# Patient Record
Sex: Female | Born: 1970 | Race: White | Hispanic: No | Marital: Married | Smoking: Never smoker
Health system: Southern US, Community
[De-identification: ages and names within clinical notes are randomized; demographics above are authoritative.]

## PROBLEM LIST (undated history)

## (undated) DIAGNOSIS — Z8249 Family history of ischemic heart disease and other diseases of the circulatory system: Secondary | ICD-10-CM

## (undated) DIAGNOSIS — O09529 Supervision of elderly multigravida, unspecified trimester: Secondary | ICD-10-CM

## (undated) DIAGNOSIS — G4713 Recurrent hypersomnia: Secondary | ICD-10-CM

## (undated) DIAGNOSIS — Z833 Family history of diabetes mellitus: Secondary | ICD-10-CM

## (undated) DIAGNOSIS — Z8742 Personal history of other diseases of the female genital tract: Secondary | ICD-10-CM

## (undated) DIAGNOSIS — N119 Chronic tubulo-interstitial nephritis, unspecified: Secondary | ICD-10-CM

## (undated) DIAGNOSIS — I471 Supraventricular tachycardia: Secondary | ICD-10-CM

## (undated) DIAGNOSIS — R3915 Urgency of urination: Secondary | ICD-10-CM

## (undated) DIAGNOSIS — Z8349 Family history of other endocrine, nutritional and metabolic diseases: Secondary | ICD-10-CM

## (undated) DIAGNOSIS — IMO0002 Reserved for concepts with insufficient information to code with codable children: Secondary | ICD-10-CM

## (undated) DIAGNOSIS — I499 Cardiac arrhythmia, unspecified: Secondary | ICD-10-CM

## (undated) DIAGNOSIS — Z820 Family history of epilepsy and other diseases of the nervous system: Secondary | ICD-10-CM

## (undated) DIAGNOSIS — F419 Anxiety disorder, unspecified: Secondary | ICD-10-CM

## (undated) DIAGNOSIS — B373 Candidiasis of vulva and vagina: Secondary | ICD-10-CM

## (undated) DIAGNOSIS — K52839 Microscopic colitis, unspecified: Secondary | ICD-10-CM

## (undated) DIAGNOSIS — Z8619 Personal history of other infectious and parasitic diseases: Secondary | ICD-10-CM

## (undated) DIAGNOSIS — R896 Abnormal cytological findings in specimens from other organs, systems and tissues: Secondary | ICD-10-CM

## (undated) DIAGNOSIS — K589 Irritable bowel syndrome without diarrhea: Secondary | ICD-10-CM

## (undated) DIAGNOSIS — M1990 Unspecified osteoarthritis, unspecified site: Secondary | ICD-10-CM

## (undated) DIAGNOSIS — Z87440 Personal history of urinary (tract) infections: Secondary | ICD-10-CM

## (undated) HISTORY — DX: Unspecified osteoarthritis, unspecified site: M19.90

## (undated) HISTORY — PX: ADENOIDECTOMY: SUR15

## (undated) HISTORY — DX: Abnormal cytological findings in specimens from other organs, systems and tissues: R89.6

## (undated) HISTORY — DX: Family history of other endocrine, nutritional and metabolic diseases: Z83.49

## (undated) HISTORY — DX: Urgency of urination: R39.15

## (undated) HISTORY — DX: Microscopic colitis, unspecified: K52.839

## (undated) HISTORY — DX: Supervision of elderly multigravida, unspecified trimester: O09.529

## (undated) HISTORY — PX: TYMPANOSTOMY TUBE PLACEMENT: SHX32

## (undated) HISTORY — DX: Family history of ischemic heart disease and other diseases of the circulatory system: Z82.49

## (undated) HISTORY — DX: Personal history of other diseases of the female genital tract: Z87.42

## (undated) HISTORY — DX: Supraventricular tachycardia: I47.1

## (undated) HISTORY — DX: Chronic tubulo-interstitial nephritis, unspecified: N11.9

## (undated) HISTORY — DX: Family history of diabetes mellitus: Z83.3

## (undated) HISTORY — DX: Personal history of urinary (tract) infections: Z87.440

## (undated) HISTORY — DX: Anxiety disorder, unspecified: F41.9

## (undated) HISTORY — DX: Reserved for concepts with insufficient information to code with codable children: IMO0002

## (undated) HISTORY — DX: Recurrent hypersomnia: G47.13

## (undated) HISTORY — DX: Family history of epilepsy and other diseases of the nervous system: Z82.0

## (undated) HISTORY — DX: Personal history of other infectious and parasitic diseases: Z86.19

## (undated) HISTORY — DX: Candidiasis of vulva and vagina: B37.3

---

## 1997-11-21 ENCOUNTER — Inpatient Hospital Stay (HOSPITAL_COMMUNITY): Admission: AD | Admit: 1997-11-21 | Discharge: 1997-11-23 | Payer: Self-pay | Admitting: Obstetrics & Gynecology

## 1998-05-08 ENCOUNTER — Other Ambulatory Visit: Admission: RE | Admit: 1998-05-08 | Discharge: 1998-05-08 | Payer: Self-pay | Admitting: Obstetrics & Gynecology

## 1999-02-23 ENCOUNTER — Other Ambulatory Visit: Admission: RE | Admit: 1999-02-23 | Discharge: 1999-02-23 | Payer: Self-pay | Admitting: *Deleted

## 1999-03-06 ENCOUNTER — Inpatient Hospital Stay (HOSPITAL_COMMUNITY): Admission: AD | Admit: 1999-03-06 | Discharge: 1999-03-06 | Payer: Self-pay | Admitting: *Deleted

## 1999-09-26 ENCOUNTER — Inpatient Hospital Stay (HOSPITAL_COMMUNITY): Admission: AD | Admit: 1999-09-26 | Discharge: 1999-09-28 | Payer: Self-pay | Admitting: *Deleted

## 2000-03-18 ENCOUNTER — Other Ambulatory Visit: Admission: RE | Admit: 2000-03-18 | Discharge: 2000-03-18 | Payer: Self-pay | Admitting: Obstetrics & Gynecology

## 2000-10-04 DIAGNOSIS — G4713 Recurrent hypersomnia: Secondary | ICD-10-CM

## 2000-10-04 HISTORY — DX: Recurrent hypersomnia: G47.13

## 2001-04-03 ENCOUNTER — Other Ambulatory Visit: Admission: RE | Admit: 2001-04-03 | Discharge: 2001-04-03 | Payer: Self-pay | Admitting: Obstetrics and Gynecology

## 2001-08-07 ENCOUNTER — Ambulatory Visit (HOSPITAL_COMMUNITY): Admission: RE | Admit: 2001-08-07 | Discharge: 2001-08-07 | Payer: Self-pay | Admitting: Obstetrics and Gynecology

## 2001-08-07 ENCOUNTER — Encounter: Payer: Self-pay | Admitting: Obstetrics and Gynecology

## 2001-08-30 ENCOUNTER — Ambulatory Visit (HOSPITAL_COMMUNITY): Admission: RE | Admit: 2001-08-30 | Discharge: 2001-08-30 | Payer: Self-pay | Admitting: Obstetrics and Gynecology

## 2001-08-30 ENCOUNTER — Encounter: Payer: Self-pay | Admitting: Obstetrics and Gynecology

## 2002-01-17 ENCOUNTER — Inpatient Hospital Stay (HOSPITAL_COMMUNITY): Admission: AD | Admit: 2002-01-17 | Discharge: 2002-01-19 | Payer: Self-pay | Admitting: Obstetrics and Gynecology

## 2002-02-28 ENCOUNTER — Other Ambulatory Visit: Admission: RE | Admit: 2002-02-28 | Discharge: 2002-02-28 | Payer: Self-pay | Admitting: Obstetrics and Gynecology

## 2003-03-15 ENCOUNTER — Other Ambulatory Visit: Admission: RE | Admit: 2003-03-15 | Discharge: 2003-03-15 | Payer: Self-pay | Admitting: Obstetrics and Gynecology

## 2003-09-05 ENCOUNTER — Other Ambulatory Visit: Admission: RE | Admit: 2003-09-05 | Discharge: 2003-09-05 | Payer: Self-pay | Admitting: Obstetrics and Gynecology

## 2004-07-05 ENCOUNTER — Inpatient Hospital Stay (HOSPITAL_COMMUNITY): Admission: AD | Admit: 2004-07-05 | Discharge: 2004-07-05 | Payer: Self-pay | Admitting: Obstetrics and Gynecology

## 2005-03-03 IMAGING — US US OB COMP LESS 14 WK
1 series · 18 of 28 positions shown · non-contrast
Comparison: none

CLINICAL DATA: 33-year-old female, 10 weeks pregnant by last menstrual period with vaginal bleeding.
OBSTETRICAL ULTRASOUND TRANSABDOMINAL AND TRANSVAGINAL

[Series 1: us ob comp<14 wk · 18 of 52 slices shown]
[im 1/52]
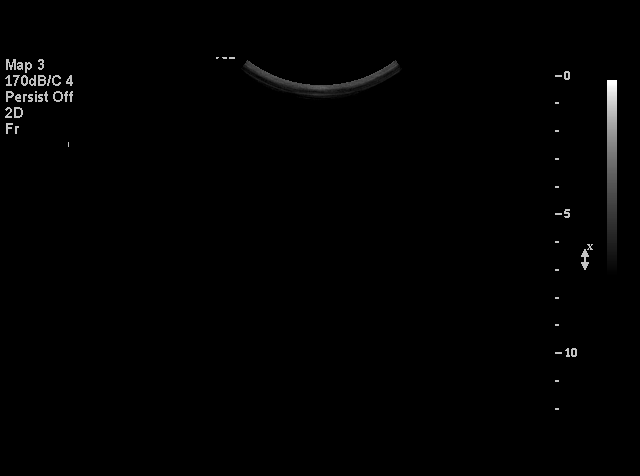
[im 4/52]
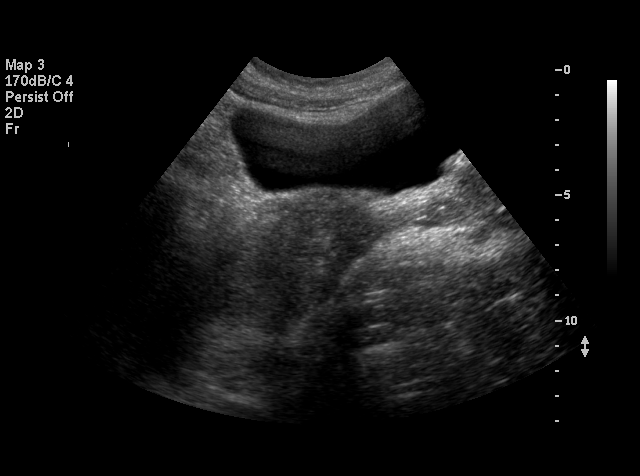
[im 6/52]
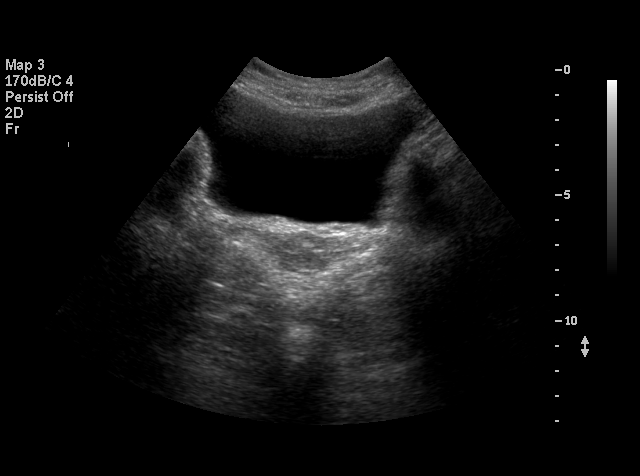
[im 10/52]
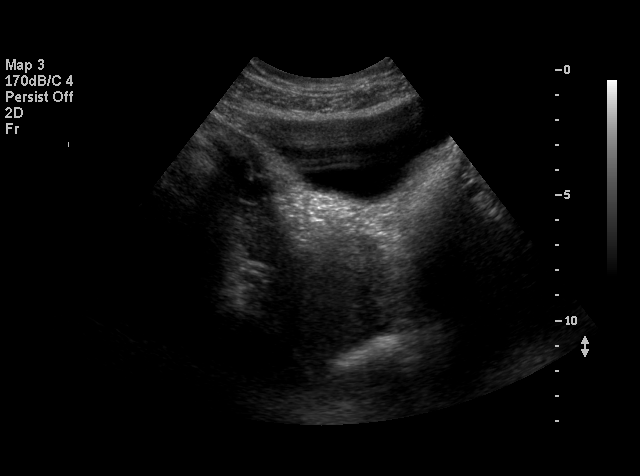
[im 14/52]
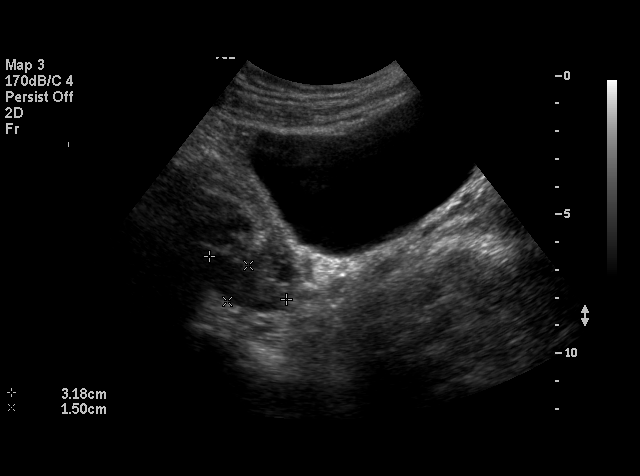
[im 16/52]
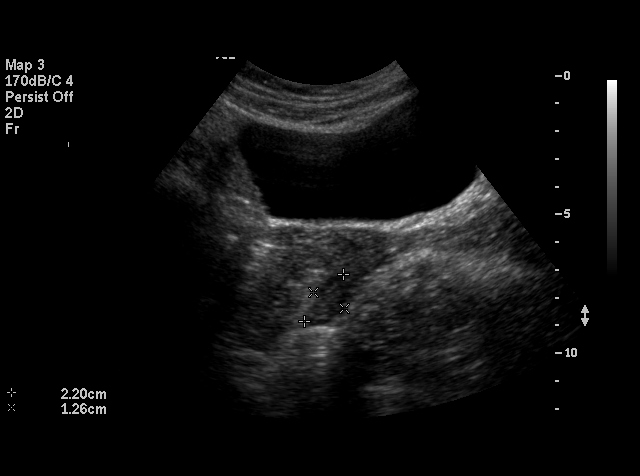
[im 19/52]
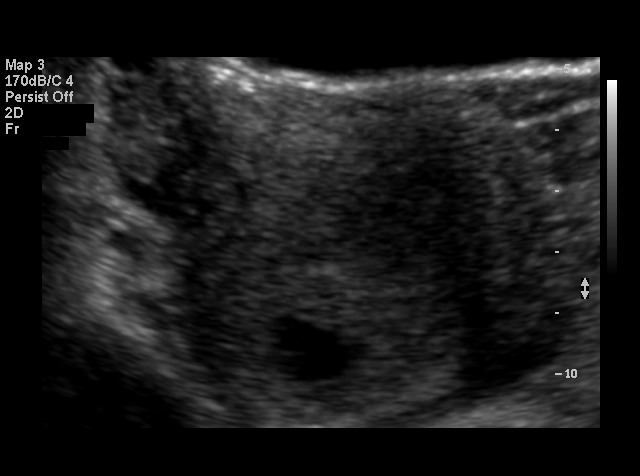
[im 21/52]
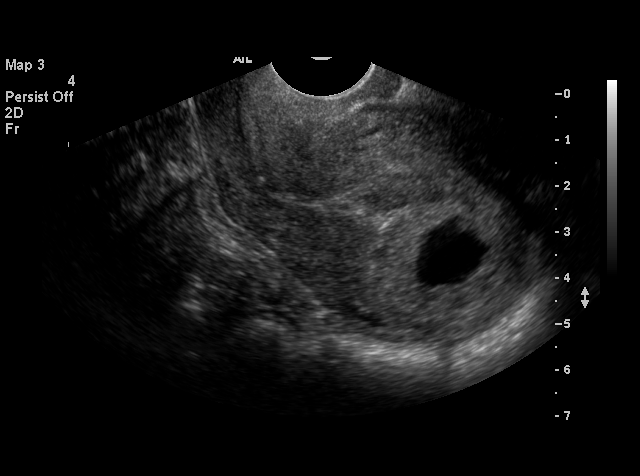
[im 25/52]
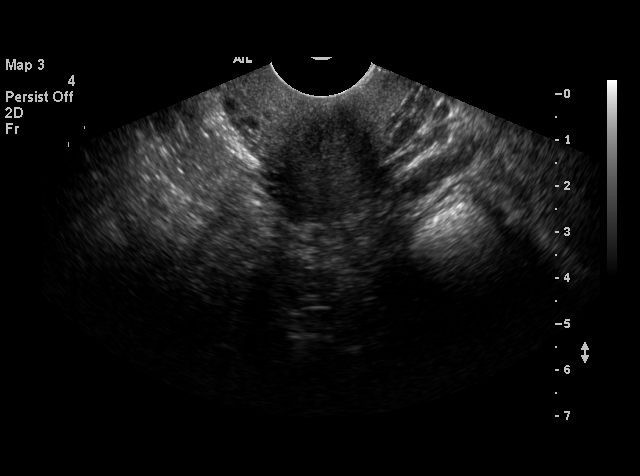
[im 27/52]
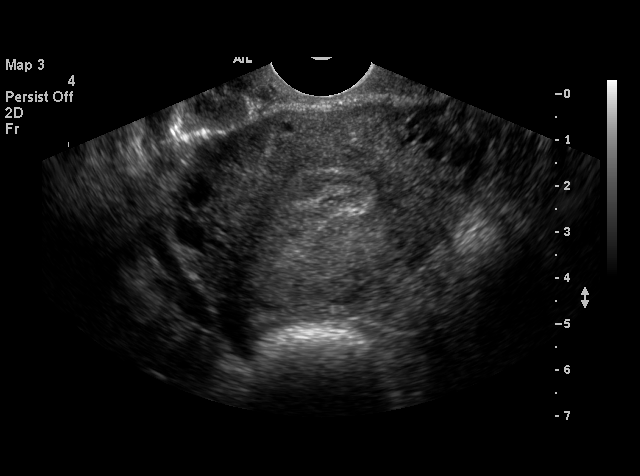
[im 31/52]
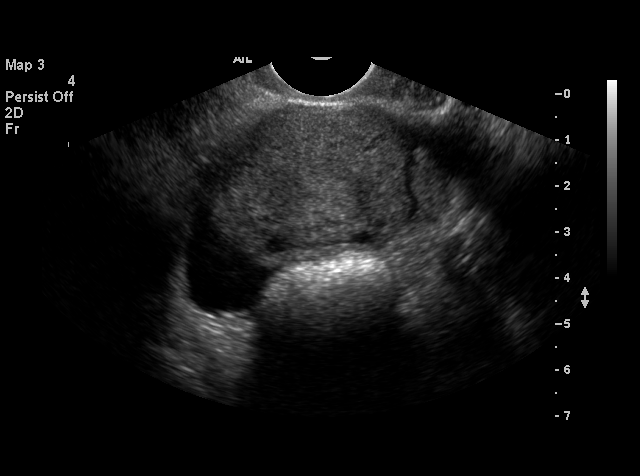
[im 33/52]
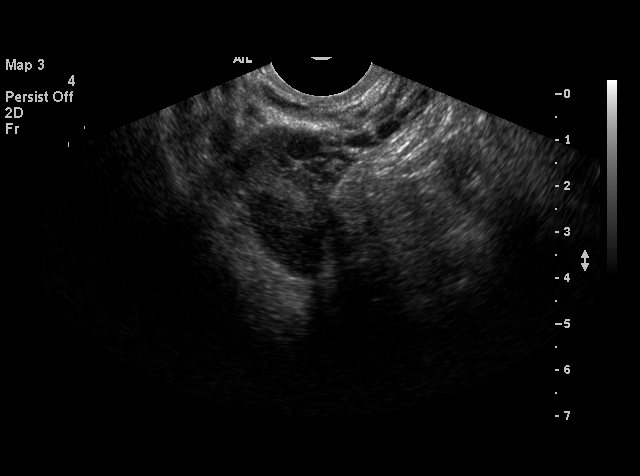
[im 36/52]
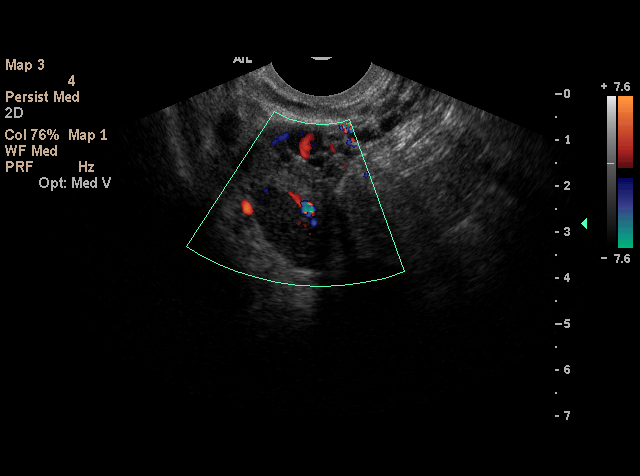
[im 40/52]
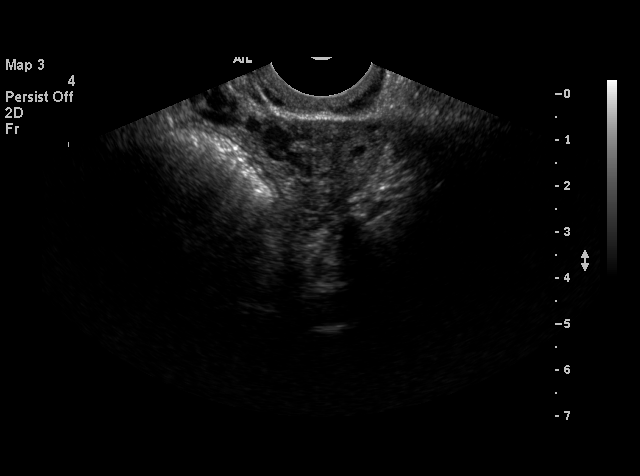
[im 42/52]
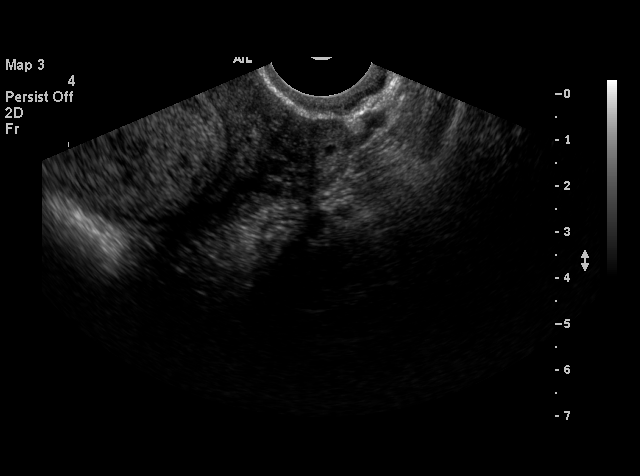
[im 46/52]
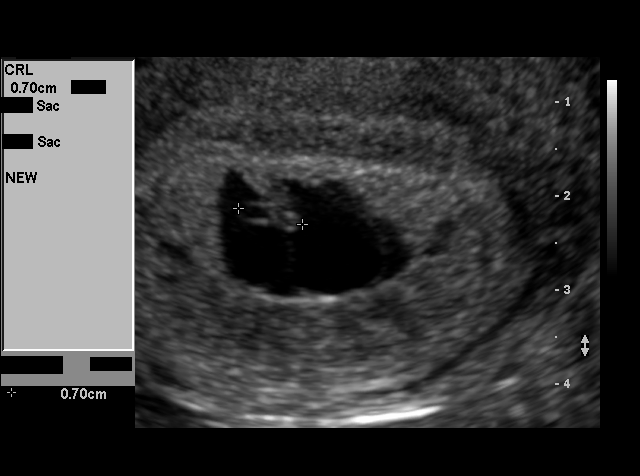
[im 48/52]
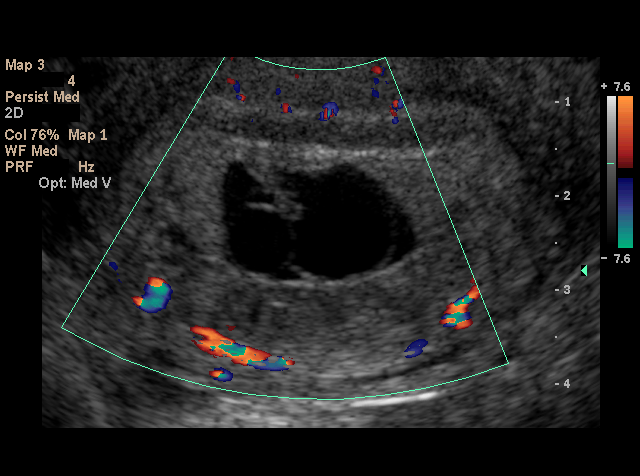
[im 52/52]
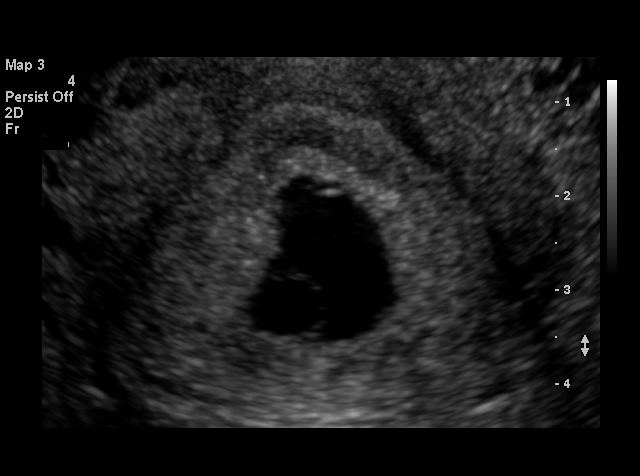

[18 of 28 positions shown; findings below may reference images not displayed]

FINDINGS: the uterus is retroverted in position with a small amount of free fluid.  Within the uterine fundus, there is an intrauterine gestational sac noted.  The yolk sac is visible as well as the amnion.  A small fetal pole is noted adjacent to the yolk sac, which is smaller than would be expected for the gestational sac and the size of the adjacent yolk sac.  By crown-rump length measurement (7 mm), the gestational age is estimated at 6 weeks 5 days.  
During the exam, there is no visualized fetal movement or cardiac movement.  With M-mode Doppler, no detectable heart rate.  The findings are consistent with a fetal demise.  Right ovary measures 2.7 x 1.8 x 1.6 cm and contains a small corpus luteum cyst measuring 1.6 x 1.2 cm.  Left ovary measures 2.2 x 1.3 x 1.9 cm.  
IMPRESSION 
Abnormal gestational sac with a small fetal pole noted in relation to the size of the yolk sac and the overall gestational sac diameter.  In addition, there is no visualized cardiac activity or detectable heart rate with M-mode Doppler.  Findings are consistent with fetal demise.  Recommend correlation with the quantitative beta hCG and follow-up as indicated.  
Gestational age by crown-rump length of 7 mm is 6 weeks 5 days.

## 2005-04-08 ENCOUNTER — Other Ambulatory Visit: Admission: RE | Admit: 2005-04-08 | Discharge: 2005-04-08 | Payer: Self-pay | Admitting: Obstetrics and Gynecology

## 2006-05-27 ENCOUNTER — Other Ambulatory Visit: Admission: RE | Admit: 2006-05-27 | Discharge: 2006-05-27 | Payer: Self-pay | Admitting: Obstetrics and Gynecology

## 2009-10-04 DIAGNOSIS — I471 Supraventricular tachycardia, unspecified: Secondary | ICD-10-CM

## 2009-10-04 HISTORY — DX: Supraventricular tachycardia, unspecified: I47.10

## 2009-10-04 HISTORY — DX: Supraventricular tachycardia: I47.1

## 2009-10-07 ENCOUNTER — Encounter: Payer: Self-pay | Admitting: Cardiovascular Disease

## 2010-03-10 ENCOUNTER — Ambulatory Visit (HOSPITAL_COMMUNITY): Admission: RE | Admit: 2010-03-10 | Discharge: 2010-03-10 | Payer: Self-pay | Admitting: Obstetrics and Gynecology

## 2010-09-08 ENCOUNTER — Ambulatory Visit: Payer: Self-pay | Admitting: Cardiovascular Disease

## 2011-01-05 LAB — HIV ANTIBODY (ROUTINE TESTING W REFLEX): HIV: NONREACTIVE

## 2011-01-05 LAB — HEPATITIS B SURFACE ANTIGEN: Hepatitis B Surface Ag: NEGATIVE

## 2011-01-05 LAB — ABO/RH: RH Type: POSITIVE

## 2011-01-05 LAB — ANTIBODY SCREEN: Antibody Screen: NEGATIVE

## 2011-01-05 LAB — RUBELLA ANTIBODY, IGM: Rubella: IMMUNE

## 2011-02-02 ENCOUNTER — Other Ambulatory Visit: Payer: Self-pay | Admitting: *Deleted

## 2011-02-02 MED ORDER — LABETALOL HCL 100 MG PO TABS: 100.0000 mg | ORAL_TABLET | Freq: Two times a day (BID) | ORAL | Status: DC

## 2011-02-02 NOTE — Telephone Encounter (Signed)
Fax received from pharmacy. Refill completed. Jodette Meredith Kilbride RN  

## 2011-02-08 ENCOUNTER — Other Ambulatory Visit: Payer: Self-pay | Admitting: Cardiovascular Disease

## 2011-02-08 MED ORDER — LABETALOL HCL 100 MG PO TABS: 100.0000 mg | ORAL_TABLET | Freq: Two times a day (BID) | ORAL | Status: DC

## 2011-02-08 NOTE — Telephone Encounter (Signed)
Medication refill 90 day supply of Labetalol

## 2011-02-19 NOTE — H&P (Signed)
[facility]  Patient:    Kelsey Johnson, Kelsey Johnson: [id number] MRN: [id number]          Service Type: OBS Location: [id number] Attending Physician:  Kelsey Johnson A Dictated by:   Kelsey Johnson, C.N.M. Admit Date:  01/17/2002                           History and Physical  CHIEF COMPLAINT:              Kelsey Johnson is a 40 year old married white female, gravida 3 para 2, 0-0-2, at [redacted] weeks gestation by last menstrual period, who presents complaining of uterine contractions every three to four minutes for the last two hours.  HISTORY OF PRESENT ILLNESS:   She denies any leaking or vaginal bleeding.  She denies any nausea, vomiting, headaches, or visual disturbances.  Her pregnancy has been followed at [facility] by the Certified Nurse Midwife service and has been essentially uncomplicated, though at risk for:                               1. Positive group B strep.                               2. Conception on oral contraceptives.                               3. Mild asthma.                               4. History of brain lesion requiring Tegretol,                                  followed by Dr. Raquel Johnson.  OB/GYN HISTORY:               1. She is gravida 3 para 2, 0-0-2, who delivered                                  a viable female infant in February 1999 that                                  weighed 7 pounds 11 ounces at [redacted] weeks                                  gestation following an eight hour labor.                                  That infants name is Kelsey Johnson, and had no                                  complications.  2. In December 2000 she delivered a viable female                                  infant who weighed 7 pounds 10 ounces at [redacted]                                  weeks gestation following a 10 hour labor,                                  and required vacuum assistance secondary  to                                  persistent OP presentation.  ALLERGIES:                    SULFA.  PAST MEDICAL HISTORY:         1. She reports having had the usual childhood                                  diseases.                               2. History of mild asthma.  Has not required                                  inhalers recently.                               3. History of occasional urinary tract                                  infections.                               4. 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 Raquel Johnson, as well as she also takes                                  Xanax occasionally (has not taken it                                  throughout this pregnancy much), and  Zoloft (she has taken every day).  PAST SURGICAL HISTORY:        1. Adenoidectomy.                               2. Tubes in ears as an infant.  FAMILY HISTORY:               Significant for maternal grandfather with MI. Paternal grandfather and paternal grandmother with chronic hypertension. Father and paternal grandfather with non-insulin dependent diabetes.  SOCIAL HISTORY:               She is married to Kelsey Johnson, who is involved and supportive.  They are of the [place] faith.  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  They deny any illicit drug use, alcohol, or smoking with this pregnancy.  PRENATAL LABORATORY DATA:     Her blood type is A-positive.  Antibody screen negative.  Syphilis nonreactive.  Rubella immune.  Hepatitis B surface antigen negative.  Pap smear normal.  GC and Chlamydia negative.  One hour glucola was 85 and maternal serum alpha-fetoprotein was within normal range.  Her 36 week beta strep was positive.  PHYSICAL EXAMINATION:  VITAL SIGNS:                   Stable.  She is afebrile.  HEENT:                        Grossly within normal limits.  HEART:                        Regular rhythm and rate.  CHEST:                        Clear.  BREAST:                       Soft, nontender.  ABDOMEN:                      Gravid, with uterine contractions every two to                               three minutes that are strong to palpation.  PELVIC:                       Her cervix is 5 cm, 100%, vertex -1 with bulging membranes.  EXTREMITIES:                  Within normal limits.  ASSESSMENT:                   1. Intrauterine pregnancy at term.                               2. Active labor.                               3. Positive group B streptococci.  PLAN:                         1.  Admit to labor and delivery.                               2. Follow routine CNM orders.                               3. Give penicillin for group B strep                                  prophylaxis.                               4. Notify the M.D. on-call of the patients                                  admission. Dictated by:   Kelsey Johnson, C.N.M. Attending Physician:  Kelsey Johnson DD:  01/17/02 TD:  01/17/02 Job: [id number]

## 2011-07-07 LAB — STREP B DNA PROBE: GBS: NEGATIVE

## 2011-07-27 ENCOUNTER — Inpatient Hospital Stay (HOSPITAL_COMMUNITY): Payer: 59 | Admitting: Anesthesiology

## 2011-07-27 ENCOUNTER — Inpatient Hospital Stay (HOSPITAL_COMMUNITY)
Admission: AD | Admit: 2011-07-27 | Discharge: 2011-07-29 | DRG: 775 | Disposition: A | Discharge status: Home / Self Care | Payer: 59 | Source: Ambulatory Visit | Attending: Obstetrics and Gynecology | Admitting: Obstetrics and Gynecology

## 2011-07-27 ENCOUNTER — Encounter (HOSPITAL_COMMUNITY): Payer: Self-pay | Admitting: Anesthesiology

## 2011-07-27 ENCOUNTER — Encounter (HOSPITAL_COMMUNITY): Payer: Self-pay | Admitting: *Deleted

## 2011-07-27 ENCOUNTER — Encounter (HOSPITAL_COMMUNITY): Payer: Self-pay | Admitting: Obstetrics and Gynecology

## 2011-07-27 DIAGNOSIS — K589 Irritable bowel syndrome without diarrhea: Secondary | ICD-10-CM | POA: Diagnosis present

## 2011-07-27 DIAGNOSIS — I471 Supraventricular tachycardia, unspecified: Secondary | ICD-10-CM | POA: Diagnosis present

## 2011-07-27 DIAGNOSIS — O99892 Other specified diseases and conditions complicating childbirth: Secondary | ICD-10-CM | POA: Diagnosis present

## 2011-07-27 DIAGNOSIS — I498 Other specified cardiac arrhythmias: Secondary | ICD-10-CM | POA: Diagnosis present

## 2011-07-27 DIAGNOSIS — IMO0002 Reserved for concepts with insufficient information to code with codable children: Secondary | ICD-10-CM | POA: Diagnosis present

## 2011-07-27 DIAGNOSIS — J45909 Unspecified asthma, uncomplicated: Secondary | ICD-10-CM | POA: Diagnosis not present

## 2011-07-27 DIAGNOSIS — Z3490 Encounter for supervision of normal pregnancy, unspecified, unspecified trimester: Secondary | ICD-10-CM

## 2011-07-27 DIAGNOSIS — O09529 Supervision of elderly multigravida, unspecified trimester: Secondary | ICD-10-CM | POA: Diagnosis present

## 2011-07-27 HISTORY — DX: Cardiac arrhythmia, unspecified: I49.9

## 2011-07-27 HISTORY — DX: Irritable bowel syndrome, unspecified: K58.9

## 2011-07-27 LAB — CBC
MCH: 30.9 pg (ref 26.0–34.0)
MCHC: 33.9 g/dL (ref 30.0–36.0)
MCV: 91.4 fL (ref 78.0–100.0)
Platelets: 294 10*3/uL (ref 150–400)
RDW: 13.3 % (ref 11.5–15.5)

## 2011-07-27 LAB — RPR: RPR Ser Ql: NONREACTIVE

## 2011-07-27 MED ORDER — PHENYLEPHRINE 40 MCG/ML (10ML) SYRINGE FOR IV PUSH (FOR BLOOD PRESSURE SUPPORT)
80.0000 ug | PREFILLED_SYRINGE | INTRAVENOUS | Status: DC | PRN
  Filled 2011-07-27: qty 5

## 2011-07-27 MED ORDER — EPHEDRINE 5 MG/ML INJ
10.0000 mg | INTRAVENOUS | Status: DC | PRN
  Filled 2011-07-27: qty 4

## 2011-07-27 MED ORDER — LABETALOL HCL 100 MG PO TABS
100.0000 mg | ORAL_TABLET | Freq: Two times a day (BID) | ORAL | Status: DC
  Administered 2011-07-27 – 2011-07-29 (×4): 100 mg via ORAL
  Filled 2011-07-27 (×5): qty 1

## 2011-07-27 MED ORDER — OXYTOCIN 10 UNIT/ML IJ SOLN: 10.0000 [IU] | Freq: Once | INTRAMUSCULAR | Status: DC

## 2011-07-27 MED ORDER — PHENYLEPHRINE 40 MCG/ML (10ML) SYRINGE FOR IV PUSH (FOR BLOOD PRESSURE SUPPORT)
80.0000 ug | PREFILLED_SYRINGE | INTRAVENOUS | Status: DC | PRN
  Filled 2011-07-27 (×2): qty 5

## 2011-07-27 MED ORDER — PRENATAL PLUS 27-1 MG PO TABS
1.0000 | ORAL_TABLET | Freq: Every day | ORAL | Status: DC
  Administered 2011-07-28: 1 via ORAL
  Filled 2011-07-27: qty 1

## 2011-07-27 MED ORDER — LACTATED RINGERS IV SOLN: 500.0000 mL | Freq: Once | INTRAVENOUS | Status: DC

## 2011-07-27 MED ORDER — MEDROXYPROGESTERONE ACETATE 150 MG/ML IM SUSP: 150.0000 mg | INTRAMUSCULAR | Status: DC | PRN

## 2011-07-27 MED ORDER — DIPHENHYDRAMINE HCL 50 MG/ML IJ SOLN: 12.5000 mg | INTRAMUSCULAR | Status: DC | PRN

## 2011-07-27 MED ORDER — MAGNESIUM HYDROXIDE 400 MG/5ML PO SUSP: 30.0000 mL | ORAL | Status: DC | PRN

## 2011-07-27 MED ORDER — LACTATED RINGERS IV SOLN: 500.0000 mL | INTRAVENOUS | Status: DC | PRN

## 2011-07-27 MED ORDER — OXYTOCIN BOLUS FROM INFUSION
500.0000 mL | Freq: Once | INTRAVENOUS | Status: DC
  Filled 2011-07-27: qty 1000
  Filled 2011-07-27: qty 500

## 2011-07-27 MED ORDER — TERBUTALINE SULFATE 1 MG/ML IJ SOLN: 0.2500 mg | Freq: Once | INTRAMUSCULAR | Status: DC | PRN

## 2011-07-27 MED ORDER — OXYTOCIN 20 UNITS IN LACTATED RINGERS INFUSION - SIMPLE: 125.0000 mL/h | Freq: Once | INTRAVENOUS | Status: DC

## 2011-07-27 MED ORDER — ONDANSETRON HCL 4 MG/2ML IJ SOLN: 4.0000 mg | Freq: Four times a day (QID) | INTRAMUSCULAR | Status: DC | PRN

## 2011-07-27 MED ORDER — SENNOSIDES-DOCUSATE SODIUM 8.6-50 MG PO TABS
2.0000 | ORAL_TABLET | Freq: Every day | ORAL | Status: DC
  Administered 2011-07-28: 2 via ORAL

## 2011-07-27 MED ORDER — METHYLERGONOVINE MALEATE 0.2 MG/ML IJ SOLN: 0.2000 mg | INTRAMUSCULAR | Status: DC | PRN

## 2011-07-27 MED ORDER — ACETAMINOPHEN 325 MG PO TABS: 650.0000 mg | ORAL_TABLET | ORAL | Status: DC | PRN

## 2011-07-27 MED ORDER — PRENATAL PLUS 27-1 MG PO TABS
1.0000 | ORAL_TABLET | Freq: Every day | ORAL | Status: DC
  Administered 2011-07-29: 1 via ORAL
  Filled 2011-07-27: qty 1

## 2011-07-27 MED ORDER — IBUPROFEN 600 MG PO TABS
600.0000 mg | ORAL_TABLET | Freq: Four times a day (QID) | ORAL | Status: DC | PRN
  Filled 2011-07-27 (×5): qty 1

## 2011-07-27 MED ORDER — EPHEDRINE 5 MG/ML INJ
10.0000 mg | INTRAVENOUS | Status: DC | PRN
  Filled 2011-07-27 (×2): qty 4

## 2011-07-27 MED ORDER — LIDOCAINE HCL (PF) 1 % IJ SOLN
30.0000 mL | INTRAMUSCULAR | Status: DC | PRN
  Filled 2011-07-27: qty 30

## 2011-07-27 MED ORDER — ZOLPIDEM TARTRATE 5 MG PO TABS: 5.0000 mg | ORAL_TABLET | Freq: Every evening | ORAL | Status: DC | PRN

## 2011-07-27 MED ORDER — WITCH HAZEL-GLYCERIN EX PADS
1.0000 "application " | MEDICATED_PAD | CUTANEOUS | Status: DC | PRN
  Administered 2011-07-28: 1 via TOPICAL

## 2011-07-27 MED ORDER — OXYTOCIN 20 UNITS IN LACTATED RINGERS INFUSION - SIMPLE
1.0000 m[IU]/min | INTRAVENOUS | Status: DC
  Administered 2011-07-27: 1 m[IU]/min via INTRAVENOUS
  Administered 2011-07-27: 333 m[IU]/min via INTRAVENOUS

## 2011-07-27 MED ORDER — FENTANYL 2.5 MCG/ML BUPIVACAINE 1/10 % EPIDURAL INFUSION (WH - ANES)
INTRAMUSCULAR | Status: DC | PRN
  Administered 2011-07-27: 14 mL/h via EPIDURAL

## 2011-07-27 MED ORDER — LIDOCAINE HCL 1.5 % IJ SOLN
INTRAMUSCULAR | Status: DC | PRN
  Administered 2011-07-27 (×2): 5 mL via EPIDURAL

## 2011-07-27 MED ORDER — METHYLERGONOVINE MALEATE 0.2 MG PO TABS: 0.2000 mg | ORAL_TABLET | ORAL | Status: DC | PRN

## 2011-07-27 MED ORDER — LANOLIN HYDROUS EX OINT: TOPICAL_OINTMENT | CUTANEOUS | Status: DC | PRN

## 2011-07-27 MED ORDER — TETANUS-DIPHTH-ACELL PERTUSSIS 5-2.5-18.5 LF-MCG/0.5 IM SUSP
0.5000 mL | Freq: Once | INTRAMUSCULAR | Status: AC
  Administered 2011-07-28: 0.5 mL via INTRAMUSCULAR
  Filled 2011-07-27 (×2): qty 0.5

## 2011-07-27 MED ORDER — OXYCODONE-ACETAMINOPHEN 5-325 MG PO TABS: 2.0000 | ORAL_TABLET | ORAL | Status: DC | PRN

## 2011-07-27 MED ORDER — BENZOCAINE-MENTHOL 20-0.5 % EX AERO
1.0000 "application " | INHALATION_SPRAY | CUTANEOUS | Status: DC | PRN
  Administered 2011-07-28: 1 via TOPICAL

## 2011-07-27 MED ORDER — LACTATED RINGERS IV SOLN
INTRAVENOUS | Status: DC
  Administered 2011-07-27 (×2): via INTRAVENOUS

## 2011-07-27 MED ORDER — CITRIC ACID-SODIUM CITRATE 334-500 MG/5ML PO SOLN: 30.0000 mL | ORAL | Status: DC | PRN

## 2011-07-27 MED ORDER — SIMETHICONE 80 MG PO CHEW: 80.0000 mg | CHEWABLE_TABLET | ORAL | Status: DC | PRN

## 2011-07-27 MED ORDER — ONDANSETRON HCL 4 MG/2ML IJ SOLN: 4.0000 mg | INTRAMUSCULAR | Status: DC | PRN

## 2011-07-27 MED ORDER — ONDANSETRON HCL 4 MG PO TABS: 4.0000 mg | ORAL_TABLET | ORAL | Status: DC | PRN

## 2011-07-27 MED ORDER — IBUPROFEN 600 MG PO TABS
600.0000 mg | ORAL_TABLET | Freq: Four times a day (QID) | ORAL | Status: DC
  Administered 2011-07-28 – 2011-07-29 (×6): 600 mg via ORAL
  Filled 2011-07-27: qty 1

## 2011-07-27 MED ORDER — FENTANYL 2.5 MCG/ML BUPIVACAINE 1/10 % EPIDURAL INFUSION (WH - ANES)
14.0000 mL/h | INTRAMUSCULAR | Status: DC
  Filled 2011-07-27: qty 60

## 2011-07-27 MED ORDER — DIBUCAINE 1 % RE OINT
1.0000 "application " | TOPICAL_OINTMENT | RECTAL | Status: DC | PRN
  Administered 2011-07-28: 1 via RECTAL
  Filled 2011-07-27: qty 28

## 2011-07-27 MED ORDER — DIPHENHYDRAMINE HCL 25 MG PO CAPS: 25.0000 mg | ORAL_CAPSULE | Freq: Four times a day (QID) | ORAL | Status: DC | PRN

## 2011-07-27 MED ORDER — OXYCODONE-ACETAMINOPHEN 5-325 MG PO TABS
1.0000 | ORAL_TABLET | ORAL | Status: DC | PRN
  Administered 2011-07-28: 1 via ORAL
  Filled 2011-07-27: qty 1
  Filled 2011-07-27: qty 2

## 2011-07-27 NOTE — Progress Notes (Signed)
.  Subjective: C/o some increased pain but coping well, feels "discouraged"   Objective: BP 140/91  Pulse 95  Temp(Src) 98.9 F (37.2 C) (Oral)  Ht 5\' 2"  (1.575 m)  Wt 68.947 kg (152 lb)  BMI 27.80 kg/m2   FHT:  FHR: 140 bpm, variability: moderate,  accelerations:  Present,  decelerations:  Absent UC:   irregular, every 5-6 minutes SVE:   Dilation: 1.5 Effacement (%): 90 Station: -1 Exam by:: Daneli Butkiewicz,cnm    Assessment / Plan: SROM No active labor No s/s chorioamnioitis    Fetal Wellbeing:  Category I Pain Control:  declines at this time  Discussed options including starting pitocin, pt agrees at this time.   Dr Stefano Gaul updated  Malissa Hippo 07/27/2011, 5:22 PM

## 2011-07-27 NOTE — Progress Notes (Signed)
Delivery Note Pt pushed w/ approximately 3 ctxs, w/ good control, attempting to allow some perineal stretching, to SVD at 8:24 PM a viable female "Kelsey Johnson" was delivered via Vaginal, Spontaneous Delivery (Presentation: Right Occiput Anterior).  APGAR: 8, 9; weight 6 lb 9 oz (2977 g).  LNC x1, reduced over body via somersault.  Placed immediately on mom's abdomen where vigorous cry w/ stimulation.   Placenta status: Intact, Spontaneous, Schultz.  Cord: 3 vessels with the following complications: None.  Cord pH: n/a.  Cord blood collected both for lab and donation.    Anesthesia: Epidural  Episiotomy: None Lacerations: 1st degree;Perineal Suture Repair: 4-0 monocryl--3 interrupted stitches Est. Blood Loss (mL): 150  Mom to postpartum.  Baby to nursery-stable. Plans to BF.  Sylvester Salonga H 07/27/2011, 9:03 PM

## 2011-07-27 NOTE — Anesthesia Procedure Notes (Signed)
Epidural Patient location during procedure: OB Start time: 07/27/2011 7:02 PM End time: 07/27/2011 7:07 PM Reason for block: procedure for pain  Staffing Anesthesiologist: Sandrea Hughs Performed by: anesthesiologist   Preanesthetic Checklist Completed: patient identified, site marked, surgical consent, pre-op evaluation, timeout performed, IV checked, risks and benefits discussed and monitors and equipment checked  Epidural Patient position: sitting Prep: site prepped and draped and DuraPrep Patient monitoring: continuous pulse ox and blood pressure Approach: midline Injection technique: LOR air  Needle:  Needle type: Tuohy  Needle gauge: 17 G Needle length: 9 cm Catheter type: closed end flexible Catheter size: 19 Gauge Catheter at skin depth: 10 cm Test dose: negative and 1.5% lidocaine  Assessment Sensory level: T10 Events: blood not aspirated, injection not painful, no injection resistance, negative IV test and no paresthesia

## 2011-07-27 NOTE — Anesthesia Preprocedure Evaluation (Signed)
Anesthesia Evaluation  Patient identified by MRN, date of birth, ID band Patient awake  General Assessment Comment  Reviewed: Allergy & Precautions, H&P , Patient's Chart, lab work & pertinent test results  Airway Mallampati: I TM Distance: >3 FB Neck ROM: full    Dental No notable dental hx.    Pulmonary    Pulmonary exam normal       Cardiovascular     Neuro/Psych Negative Neurological ROS  Negative Psych ROS   GI/Hepatic negative GI ROS Neg liver ROS    Endo/Other  Negative Endocrine ROS  Renal/GU negative Renal ROS  Genitourinary negative   Musculoskeletal negative musculoskeletal ROS (+)   Abdominal Normal abdominal exam  (+)   Peds negative pediatric ROS (+)  Hematology negative hematology ROS (+)   Anesthesia Other Findings   Reproductive/Obstetrics (+) Pregnancy                           Anesthesia Physical Anesthesia Plan  ASA: II  Anesthesia Plan: Epidural   Post-op Pain Management:    Induction:   Airway Management Planned:   Additional Equipment:   Intra-op Plan:   Post-operative Plan:   Informed Consent: I have reviewed the patients History and Physical, chart, labs and discussed the procedure including the risks, benefits and alternatives for the proposed anesthesia with the patient or authorized representative who has indicated his/her understanding and acceptance.     Plan Discussed with:   Anesthesia Plan Comments: (H/O SVT)        Anesthesia Quick Evaluation

## 2011-07-27 NOTE — Progress Notes (Signed)
.  Subjective: Feels like some ctx are stronger about every 5 minutes, has been ambulating in room, still leaking clear fluid   Objective: BP 129/75  Pulse 90  Ht 5\' 2"  (1.575 m)  Wt 68.947 kg (152 lb)  BMI 27.80 kg/m2   FHT:  FHR: 140 bpm, variability: moderate,  accelerations:  Present,  decelerations:  Absent UC:   irregular, every 5-8 minutes SVE:    deferred    Assessment / Plan: latent/prodromal labor GBS neg SROM x10 hours   Fetal Wellbeing:  Category I Pain Control:  none at this time, pt desires to avoid pain medication  Update physician PRN  Malissa Hippo 07/27/2011, 2:07 PM

## 2011-07-27 NOTE — H&P (Signed)
Kelsey Johnson is a 39 y.o. female presenting for rupture of membranes at 0400 with clear fluid. Pt states irreg ctx started at 0200, denies VB, +FM. Ctx are now mild and irreg. Pt began [facility] at [facility] at 8wks and has been followed by the CNM service.  Pregnancy significant for: 1. AMA 2. Asthma 3. IBS 4. SVT - on labetalol    Maternal Medical History:  Reason for admission: Reason for admission: rupture of membranes.  Contractions: Onset was 6-12 hours ago.   Frequency: irregular.   Duration is approximately 60 seconds.   Perceived severity is mild.    Fetal activity: Perceived fetal activity is normal.   Last perceived fetal movement was within the past hour.    Prenatal complications: no prenatal complications   OB History    Grav Para Term Preterm Abortions TAB SAB Ect Mult Living   5 3 3  0 1 0 1 0 0 3     Past Medical History  Diagnosis Date  . Asthma   . Dysrhythmia SVT  . IBS (irritable bowel syndrome)   . SVT (supraventricular tachycardia)    Past Surgical History  Procedure Date  . Tympanostomy tube placement     as a child  . Adenoidectomy     as a child   Family History: family history is not on file.MI - MGF: CHTN - father, PGF, PGM: Asthma - daughters: Diabetes - Father, PGF: Thyroid disorder - mother Social History:  reports that she has never smoked. She has never used smokeless tobacco. She reports that she does not drink alcohol or use illicit drugs. 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   Review of Systems  All other systems reviewed and are negative.      Blood pressure 141/81, pulse 103, height 5\' 2"  (1.575 m), weight 68.947 kg (152 lb). Maternal Exam:  Uterine Assessment: Contraction strength is mild.  Contraction duration is 60 seconds. Contraction frequency is irregular.   Abdomen: Patient reports no abdominal tenderness. Fundal height is aga.   Estimated fetal weight is 7#5oz.   Fetal presentation:  vertex  Introitus: Ferning test: positive.  Nitrazine test: positive. Amniotic fluid character: clear. At office  Cervix: Cervix evaluated by digital exam.   Checked at office 1-2cm  Fetal Exam Fetal Monitor Review: Mode: ultrasound.   Baseline rate: 130.  Variability: moderate (6-25 bpm).   Pattern: accelerations present and no decelerations.    Fetal State Assessment: Category I - tracings are normal.     Physical Exam  Constitutional: She is oriented to person, place, and time. She appears well-developed and well-nourished.  Cardiovascular: Normal rate, regular rhythm and normal heart sounds.   Respiratory: Effort normal and breath sounds normal.  GI: Soft.  Musculoskeletal: Normal range of motion.  Neurological: She is alert and oriented to person, place, and time.  Skin: Skin is warm and dry.  Psychiatric: She has a normal mood and affect. Her behavior is normal.    Prenatal labs: ABO, Rh: A/Positive/-- (04/03 0000) Antibody: Negative (04/03 0000) Rubella: Immune (04/03 0000) RPR: Nonreactive (04/03 0000)  HBsAg: Negative (04/03 0000)  HIV: Non-reactive (04/03 0000)  GBS: Negative (10/03 0000)  1hr GTT 119 Quad screen - declined  Assessment/Plan: 40wk IUP SROM Latent labor GBS neg FHR reassuring  Admit to B.S. Dr Stefano Gaul attending Routine labs Hold IV Intermittent EFM Will reevaluate about 1600 Pt declines augmentation at this time   Rowyn Mustapha M 07/27/2011,  12:50 PM

## 2011-07-28 LAB — CBC
Hemoglobin: 10.6 g/dL — ABNORMAL LOW (ref 12.0–15.0)
MCH: 31.4 pg (ref 26.0–34.0)
MCV: 92.9 fL (ref 78.0–100.0)
RBC: 3.38 MIL/uL — ABNORMAL LOW (ref 3.87–5.11)
WBC: 12.6 10*3/uL — ABNORMAL HIGH (ref 4.0–10.5)

## 2011-07-28 MED ORDER — BENZOCAINE-MENTHOL 20-0.5 % EX AERO
INHALATION_SPRAY | CUTANEOUS | Status: AC
  Administered 2011-07-28: 1 via TOPICAL
  Filled 2011-07-28: qty 56

## 2011-07-28 NOTE — Anesthesia Postprocedure Evaluation (Signed)
Anesthesia Post Note  Patient: Kelsey Johnson  Procedure(s) Performed: * No procedures listed *  Anesthesia type: Epidural  Patient location: Mother/Baby  Post pain: Pain level controlled  Post assessment: Post-op Vital signs reviewed  Last Vitals:  Filed Vitals:   07/28/11 0455  BP: 128/75  Pulse: 78  Temp: 36.9 C  Resp: 20    Post vital signs: Reviewed  Level of consciousness: awake  Complications: No apparent anesthesia complications 

## 2011-07-28 NOTE — Addendum Note (Signed)
Addendum  created 07/28/11 0806 by Doreene Burke   Modules edited:Charges VN, Notes Section

## 2011-07-28 NOTE — Progress Notes (Addendum)
Post Partum Day 1 Subjective: no complaints.  Doing well.  Breastfeeding.  Objective: Blood pressure 128/75, pulse 78, temperature 98.4 F (36.9 C), temperature source Oral, resp. rate 20, height 5\' 2"  (1.575 m), weight 68.947 kg (152 lb), SpO2 100.00%, unknown if currently breastfeeding.  Physical Exam:  General: alert Lochia: appropriate Uterine Fundus: firm Incision: healing well DVT Evaluation: No evidence of DVT seen on physical exam. Negative Homan's sign.   Basename 07/28/11 0450 07/27/11 1200  HGB 10.6* 12.9  HCT 31.4* 38.1    Assessment/Plan: Plan for discharge tomorrow   LOS: 1 day   LATHAM, VICKI 07/28/2011, 9:35 AM    Agree with Above - AYR

## 2011-07-28 NOTE — Anesthesia Postprocedure Evaluation (Signed)
Anesthesia Post Note  Patient: Kelsey Johnson  Procedure(s) Performed: * No procedures listed *  Anesthesia type: Epidural  Patient location: Mother/Baby  Post pain: Pain level controlled  Post assessment: Post-op Vital signs reviewed  Last Vitals:  Filed Vitals:   07/28/11 0455  BP: 128/75  Pulse: 78  Temp: 36.9 C  Resp: 20    Post vital signs: Reviewed  Level of consciousness: awake  Complications: No apparent anesthesia complications

## 2011-07-29 MED ORDER — NORETHINDRONE 0.35 MG PO TABS: 1.0000 | ORAL_TABLET | Freq: Every day | ORAL | Status: DC

## 2011-07-29 NOTE — Progress Notes (Signed)
Post Partum Day 2 Subjective: no complaints.  Working on breastfeeding.  Ready for discharge.  Desires Micronor.  Objective: Blood pressure 124/79, pulse 86, temperature 98.2 F (36.8 C), temperature source Oral, resp. rate 18, height 5\' 2"  (1.575 m), weight 68.947 kg (152 lb), SpO2 100.00%, unknown if currently breastfeeding.  Physical Exam:  General: alert Lochia: appropriate Uterine Fundus: firm Incision: healing well DVT Evaluation: No evidence of DVT seen on physical exam. Negative Homan's sign.   Basename 07/28/11 0450 07/27/11 1200  HGB 10.6* 12.9  HCT 31.4* 38.1    Assessment/Plan: Discharge home Motrin OTC, Rx Micronor.  Continue Labetolol for SVT F/U in 6 weeks at [facility].   LOS: 2 days   Kolter Reaver, Chip Boer 07/29/2011, 7:52 AM

## 2011-07-29 NOTE — Discharge Summary (Signed)
Obstetric Discharge Summary Reason for Admission: onset of labor Prenatal Procedures: U/S Intrapartum Procedures: spontaneous vaginal delivery Postpartum Procedures: none Complications-Operative and Postpartum: 1st degree perineal laceration Hemoglobin  Date Value Range Status  07/28/2011 10.6* 12.0-15.0 (g/dL) Final     DELTA CHECK NOTED     REPEATED TO VERIFY     HCT  Date Value Range Status  07/28/2011 31.4* 36.0-46.0 (%) Final    Discharge Diagnoses: Term Pregnancy-delivered  Discharge Information: Date: 07/29/2011 Activity: Per [facility] handout Diet: routine Medications: Ibuprofen OTC, Micronor, Continue Labetolol for SVT Condition: stable Instructions: refer to practice specific booklet Discharge to: home Follow-up Information    Follow up with [facility] in 6 weeks.         Newborn Data: Live born female  Birth Weight: 6 lb 9 oz (2977 g) APGAR: 8, 9  Home with mother.  Nigel Bridgeman 07/29/2011, 7:48 AM

## 2011-11-03 ENCOUNTER — Encounter: Payer: Self-pay | Admitting: *Deleted

## 2011-11-11 ENCOUNTER — Ambulatory Visit (INDEPENDENT_AMBULATORY_CARE_PROVIDER_SITE_OTHER): Payer: 59 | Admitting: Cardiovascular Disease

## 2011-11-11 ENCOUNTER — Encounter: Payer: Self-pay | Admitting: Cardiovascular Disease

## 2011-11-11 VITALS — BP 132/84 | HR 74 | Ht 62.0 in | Wt 128.8 lb

## 2011-11-11 DIAGNOSIS — I498 Other specified cardiac arrhythmias: Secondary | ICD-10-CM

## 2011-11-11 DIAGNOSIS — I471 Supraventricular tachycardia: Secondary | ICD-10-CM

## 2011-11-11 NOTE — Progress Notes (Signed)
    Kelsey Johnson Date of Birth  July 30, 1971 Kelsey Johnson [facility]     Kelsey Johnson Office  [address]    [address]   [address] [place], [place]  [address]    [place], [place]  [address] [phone]  Fax  [phone]  [phone]  Fax [phone]  Problem list: 1. Tachycardia 2. Syncope  History of Present Illness:  Nature is doing well.  She was pregnant this past year and delivered a healthy baby in October. We changed her from metoprolol to labetalol while she was pregnant.  She's interested in stopping all of her medications. She's not had any problems.  Current Outpatient Prescriptions on File Prior to Visit  Medication Sig Dispense Refill  . labetalol (NORMODYNE) 100 MG tablet Take 1 tablet (100 mg total) by mouth 2 (two) times daily.  180 tablet  3  . norethindrone (ORTHO MICRONOR) 0.35 MG tablet Take 1 tablet (0.35 mg total) by mouth daily.  1 Package  11  . prenatal vitamin w/FE, FA (PRENATAL 1 + 1) 27-1 MG TABS Take 1 tablet by mouth daily.          Allergies  Allergen Reactions  . Sulfa Antibiotics Other (See Comments)    Reaction unknown child    Past Medical History  Diagnosis Date  . Asthma   . Dysrhythmia SVT  . IBS (irritable bowel syndrome)   . SVT (supraventricular tachycardia)   . FHx: hypertension   . FHx: thyroid disease   . FHx: diabetes mellitus   . FHx: migraine headaches     Past Surgical History  Procedure Date  . Tympanostomy tube placement     as a child  . Adenoidectomy     as a child    History  Smoking status  . Never Smoker   Smokeless tobacco  . Never Used    History  Alcohol Use No    History reviewed. No pertinent family history.  Reviw of Systems:  Reviewed in the HPI.  All other systems are negative.  Physical Exam: Blood pressure 132/84, pulse 74, height 5\' 2"  (1.575 m), weight 128 lb 12.8 oz (58.423 kg). General: Well developed, well nourished, in no acute distress.  Head: Normocephalic, atraumatic,  sclera non-icteric, mucus membranes are moist,   Neck: Supple. Negative for carotid bruits. JVD not elevated.  Lungs: Clear bilaterally to auscultation without wheezes, rales, or rhonchi. Breathing is unlabored.  Heart: RRR with S1 S2. No murmurs, rubs, or gallops appreciated.  Abdomen: Soft, non-tender, non-distended with normoactive bowel sounds. No hepatomegaly. No rebound/guarding. No obvious abdominal masses.  Msk:  Strength and tone appear normal for age.  Extremities: No clubbing or cyanosis. No edema.  Distal pedal pulses are 2+ and equal bilaterally.  Neuro: Alert and oriented X 3. Moves all extremities spontaneously.  Psych:  Responds to questions appropriately with a normal affect.  ECG: NSR. No ST or T wave changes  Assessment / Plan:

## 2011-11-11 NOTE — Patient Instructions (Signed)
Your physician wants you to follow-up in: 1 YEAR,  You will receive a reminder letter in the mail two months in advance. If you don't receive a letter, please call our office to schedule the follow-up appointment.

## 2011-11-11 NOTE — Assessment & Plan Note (Signed)
Kelsey Johnson has done very well. She's not had any further episodes of tachycardia. She would like to try to come off the beta blocker completely. I told her how to taper the labetalol by dividing and half every week until she is completely off. She is to take 50 mg twice a day for one week and then take 50 mg once a day for one week.  We discussed the fact that we could put her on propranolol on an as-needed basis. I'll see her again in one year. She will call us if she has any other problems.

## 2012-02-17 ENCOUNTER — Other Ambulatory Visit: Payer: Self-pay | Admitting: Cardiovascular Disease

## 2012-02-17 MED ORDER — LABETALOL HCL 100 MG PO TABS: 100.0000 mg | ORAL_TABLET | Freq: Two times a day (BID) | ORAL | Status: DC

## 2012-04-07 ENCOUNTER — Ambulatory Visit (INDEPENDENT_AMBULATORY_CARE_PROVIDER_SITE_OTHER): Payer: 59 | Admitting: Obstetrics and Gynecology

## 2012-04-07 ENCOUNTER — Encounter: Payer: Self-pay | Admitting: Obstetrics and Gynecology

## 2012-04-07 VITALS — BP 110/72 | HR 70 | Wt 114.0 lb

## 2012-04-07 DIAGNOSIS — IMO0002 Reserved for concepts with insufficient information to code with codable children: Secondary | ICD-10-CM | POA: Insufficient documentation

## 2012-04-07 NOTE — Progress Notes (Signed)
41y/o s/p SVD Oct 2012, now complaining of Dyspareunia. No other complaints. Continues to breast feed. Birth Control: Micronor Has had some break through bleeding with Micronor but has not had return of menses. Examination of external genitalia: Labia: Bilat Normal no swelling/ redness Introitus: Scar tissue to Rt where looks like 2nd degree laceration had occurred at child birth.                Slight webbingf of the Forchette. Patient has been shown same with mirror and states that this is the area that has been painful during intercourse. Impression: Painful intercourse due to scar tissue and webbing of skin at introitus. Plan: Patient advised to massage Forchette with olive oil or mineral oil - demonstration given.          Advised to return PRN

## 2012-07-20 ENCOUNTER — Telehealth: Payer: Self-pay | Admitting: Obstetrics and Gynecology

## 2012-07-20 NOTE — Telephone Encounter (Signed)
FAx recevied for RF PCP.   TC to [facility].  Per Shiny confirmed RF available till 11/15/2012.

## 2012-07-31 ENCOUNTER — Other Ambulatory Visit: Payer: Self-pay | Admitting: Family Medicine

## 2012-08-02 ENCOUNTER — Ambulatory Visit
Admission: RE | Admit: 2012-08-02 | Discharge: 2012-08-02 | Disposition: A | Discharge status: Home / Self Care | Payer: 59 | Source: Ambulatory Visit | Attending: Family Medicine | Admitting: Family Medicine

## 2012-08-02 MED ORDER — IOHEXOL 300 MG/ML  SOLN
100.0000 mL | Freq: Once | INTRAMUSCULAR | Status: AC | PRN
  Administered 2012-08-02: 100 mL via INTRAVENOUS

## 2012-08-03 ENCOUNTER — Telehealth: Payer: Self-pay | Admitting: Obstetrics and Gynecology

## 2012-08-03 NOTE — Telephone Encounter (Signed)
Tc to pt, pt states she has been taking an antibiotic and thinks she may have a yeast infection. Pt got rx for diflucan filled which she was given last year but she does not think it is helping. Advised pt to come into the office for evaluation, pt declines. Also advised pt that she can try to take monistat otc and to call the office back if it does not provide relief after she completes the entire regiment. Pt agreeable.

## 2012-08-04 ENCOUNTER — Other Ambulatory Visit (HOSPITAL_COMMUNITY): Payer: Self-pay | Admitting: Family Medicine

## 2012-08-04 DIAGNOSIS — L0231 Cutaneous abscess of buttock: Secondary | ICD-10-CM

## 2012-08-07 ENCOUNTER — Ambulatory Visit (HOSPITAL_COMMUNITY)
Admission: RE | Admit: 2012-08-07 | Discharge: 2012-08-07 | Disposition: A | Discharge status: Home / Self Care | Payer: 59 | Source: Ambulatory Visit | Attending: Family Medicine | Admitting: Family Medicine

## 2012-08-07 DIAGNOSIS — L0231 Cutaneous abscess of buttock: Secondary | ICD-10-CM | POA: Insufficient documentation

## 2012-08-07 DIAGNOSIS — L03317 Cellulitis of buttock: Secondary | ICD-10-CM | POA: Insufficient documentation

## 2012-08-07 MED ORDER — SODIUM BICARBONATE 4 % IV SOLN
INTRAVENOUS | Status: AC
  Filled 2012-08-07: qty 5

## 2012-08-07 NOTE — Procedures (Signed)
Procedure:  Left gluteal abscess drainage 5Fr Yueh cath inserted into left gluteal abscess, yielding 10 mL of purulent fluid.  Samples sent for cuture analysis.

## 2012-08-10 LAB — CULTURE, ROUTINE-ABSCESS

## 2012-08-12 LAB — ANAEROBIC CULTURE

## 2012-09-01 LAB — FUNGUS CULTURE W SMEAR: Fungal Smear: NONE SEEN

## 2012-12-06 ENCOUNTER — Telehealth: Payer: Self-pay

## 2012-12-06 NOTE — Telephone Encounter (Signed)
2 RF's auth'd to [facility]  On pt's Norethindrone 0.35 mg tabs until pt can get in for AEX. NO further refills til AEX is complete. Melody Comas A

## 2013-03-06 ENCOUNTER — Telehealth: Payer: Self-pay | Admitting: Cardiovascular Disease

## 2013-03-06 NOTE — Telephone Encounter (Signed)
New Problem  Pt is concerned because she had shortness of breath, extremely tired and had some stomach issues. She wants to speak with a nurse. She is past due for a 1 year follow up with Dr Elease Hashimoto, that I offered to schedule for July 28, which is his first available. But she said she would rather just speak with you first.

## 2013-03-06 NOTE — Telephone Encounter (Signed)
This am woke with nausea, no emesis. Feeling tired and had some sob. Pt went back to bed and slept 45 minutes and felt better, no cp. Pt made app with pcp and will call if further assistance is needed.

## 2013-03-31 IMAGING — CT CT PELVIS W/ CM
3 series · 13 of 36 positions shown, 19 images · IV contrast (READICAT/WATER & [ID] OMNI 300)
Comparison: None.

CLINICAL DATA: Painful left buttock lesion with some redness

CT PELVIS WITH CONTRAST
TECHNIQUE: Multidetector CT imaging of the pelvis was performed
using the standard protocol following the bolus administration of
intravenous contrast.
Contrast: 100mL OMNIPAQUE IOHEXOL 300 MG/ML  SOLN

[Series 3: routine pelvis · axial · 0.85mm/px · z∈[-252,-56]mm · 6 of 55 slices shown, 11 images]
[im 8/55  soft-tissue]
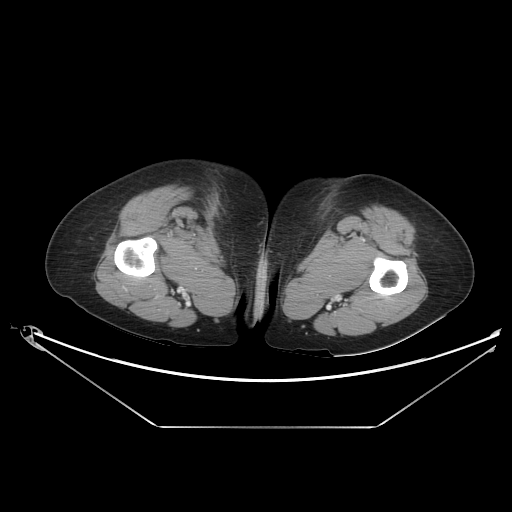
[im 8/55  bone]
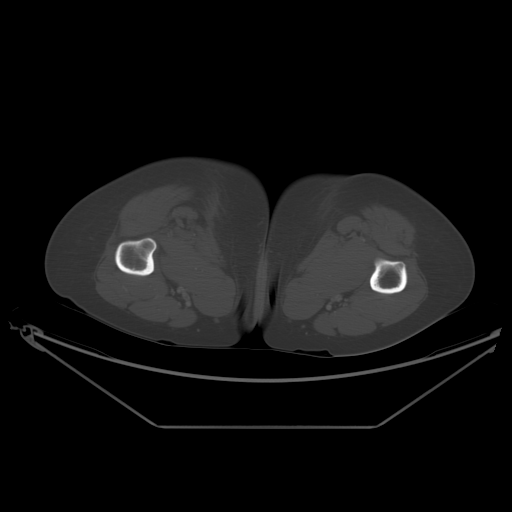
[im 16/55  soft-tissue]
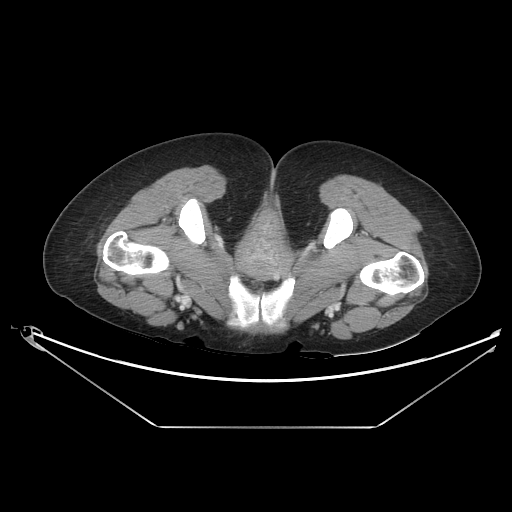
[im 24/55  soft-tissue]
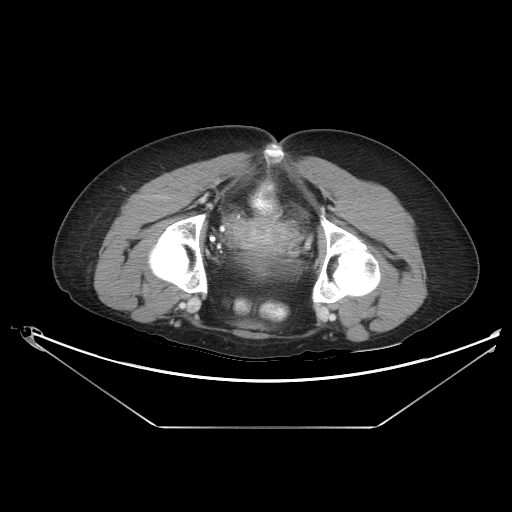
[im 24/55  lung]
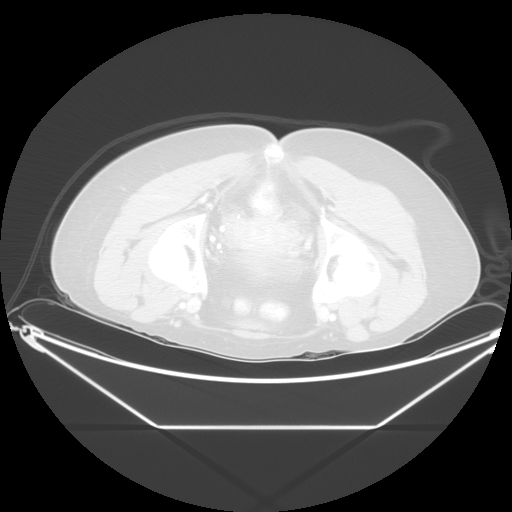
[im 31/55  soft-tissue]
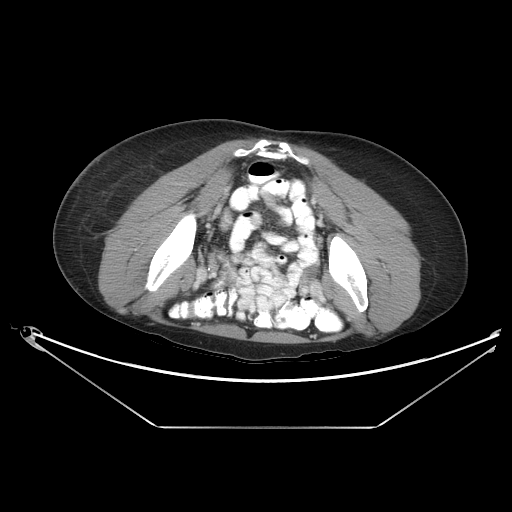
[im 31/55  lung]
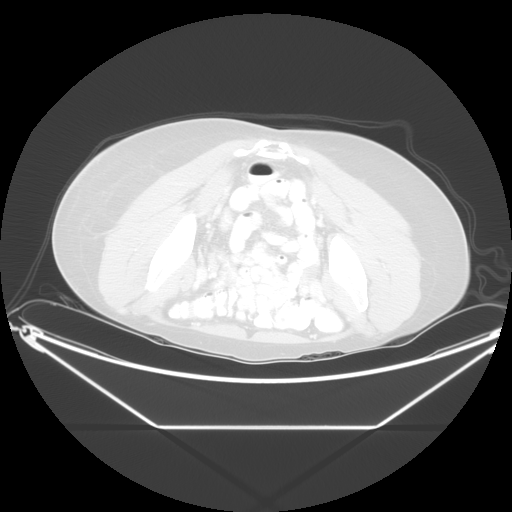
[im 39/55  soft-tissue]
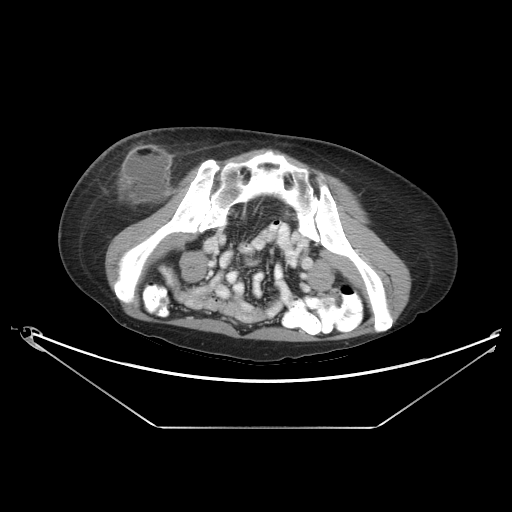
[im 39/55  lung]
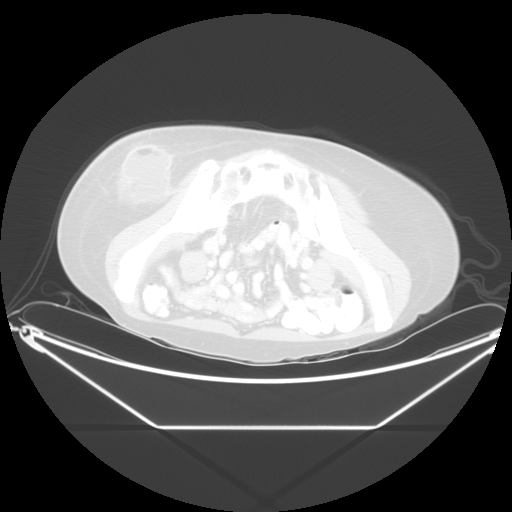
[im 47/55  soft-tissue]
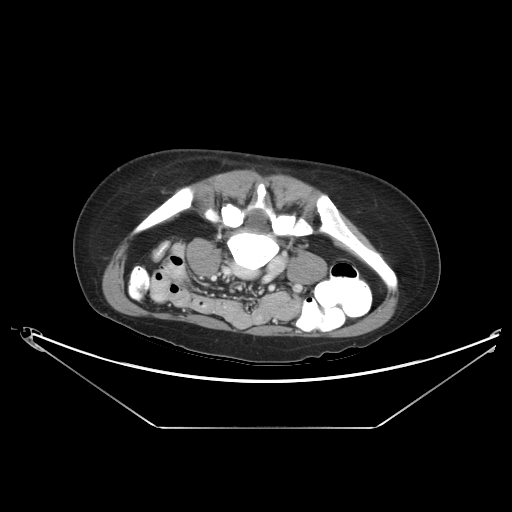
[im 47/55  lung]
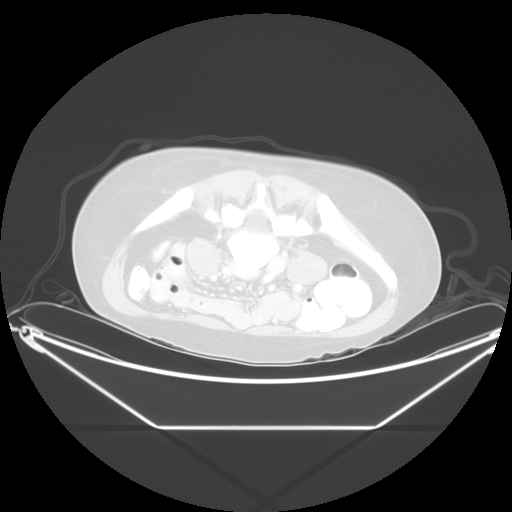

[Series 601: coronal body · coronal · 0.85mm/px · 1 of 86 slices shown, 2 images]
[im 29/86  soft-tissue]
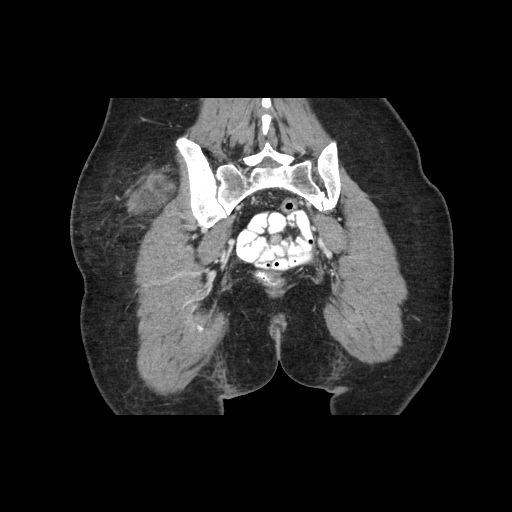
[im 29/86  bone]
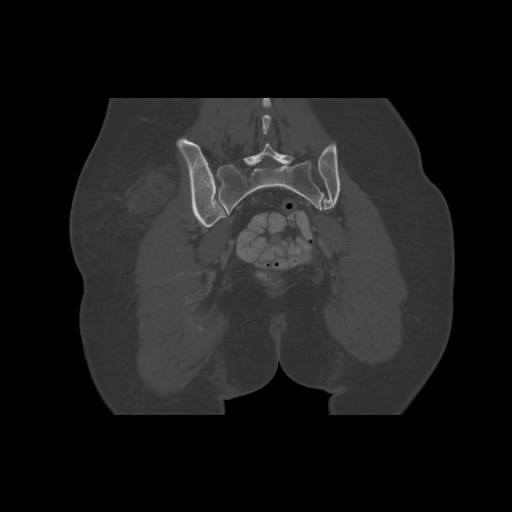

[Series 602: sagittal body · sagittal · 0.85mm/px · 6 of 160 slices shown]
[im 14/160  soft-tissue]
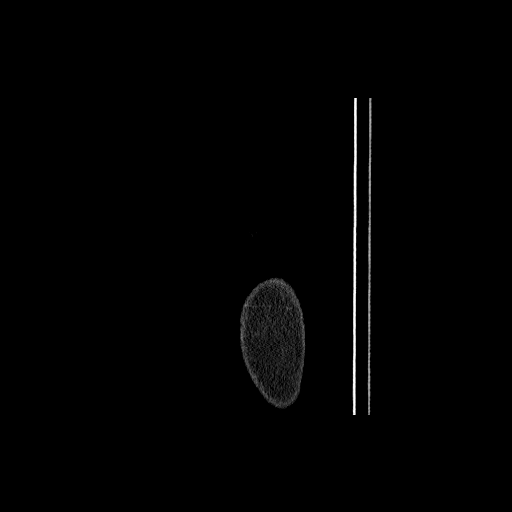
[im 35/160  soft-tissue]
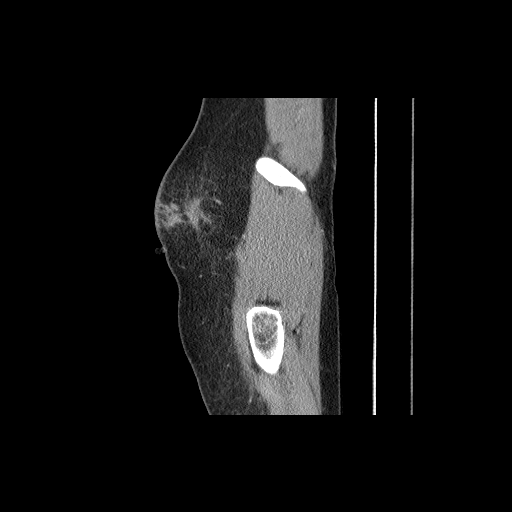
[im 49/160  soft-tissue]
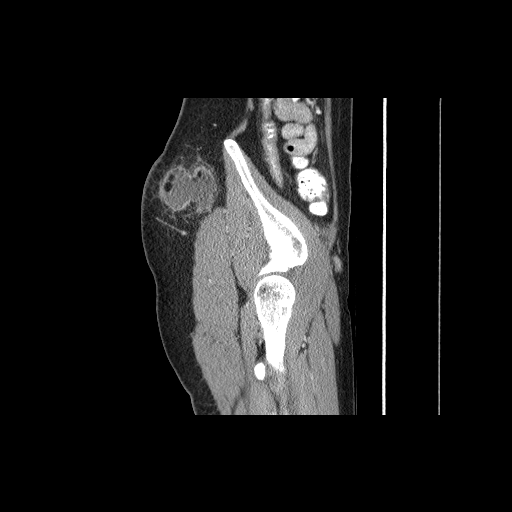
[im 70/160  soft-tissue]
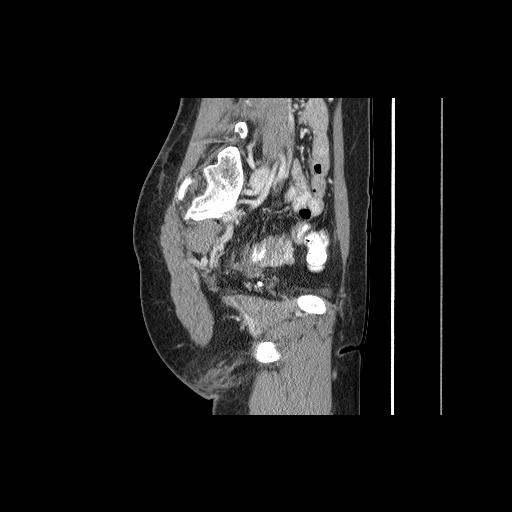
[im 90/160  soft-tissue]
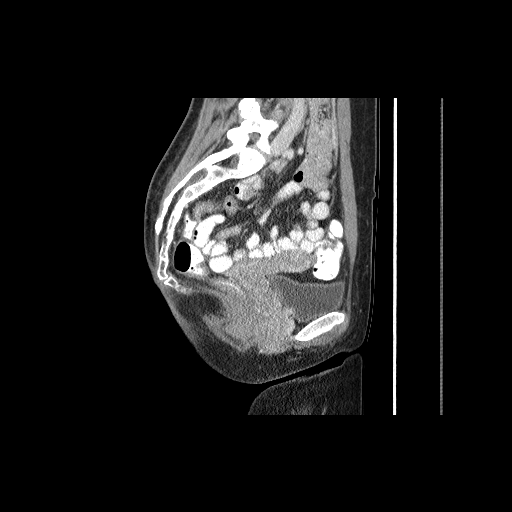
[im 111/160  soft-tissue]
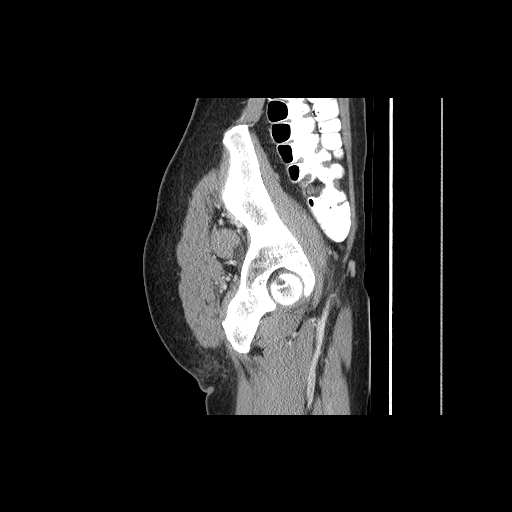

[13 of 36 positions shown; findings below may reference images not displayed]

FINDINGS: The patient was scanned in the prone position in view of
the buttock abnormality.  Within the left buttock there is a well-
circumscribed collection of fluid with some air consistent with an
abscess measuring 4.8 x 4.0 x 4.3 cm.  Percutaneous drainage could
be performed if necessary clinically.  The underlying bony
structures appear intact.

The uterus is normal in size and anteverted.  No free fluid is seen
within the pelvis.  The urinary bladder is moderately urine
distended with no abnormality noted.  No bony abnormality is seen.
IMPRESSION: Abscess within the left buttocks approximately 1.2 cm in depth
measuring 4.8 x 4.0 x 3 4.3 cm.

## 2013-04-05 IMAGING — US US ABSCESS DRAINAGE W/ CATHETER
1 series · 13 of 24 positions shown · non-contrast
Comparison: none

CLINICAL DATA: Left gluteal abscess measuring approximately 4.8 cm
in greatest diameter by recent CT evaluation.

[Series 1: us abscess drainage w/ catheter · 0.08mm/px · 24 acquisitions, 13 frames shown]
[im 1/24]
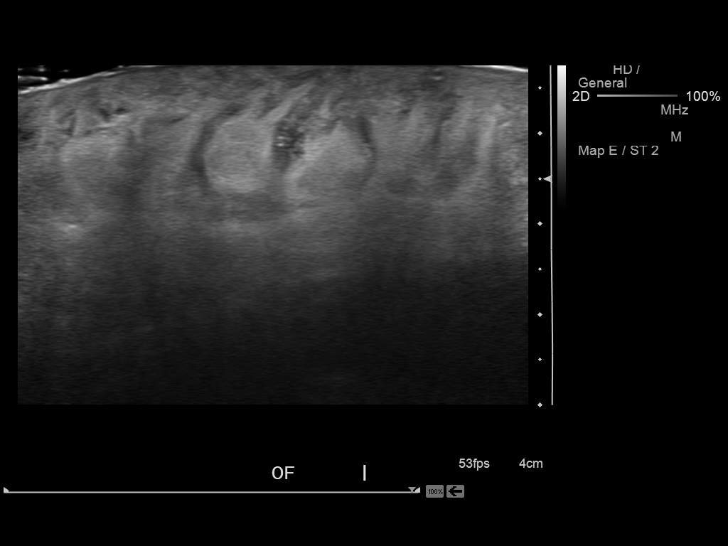
[im 3/24]
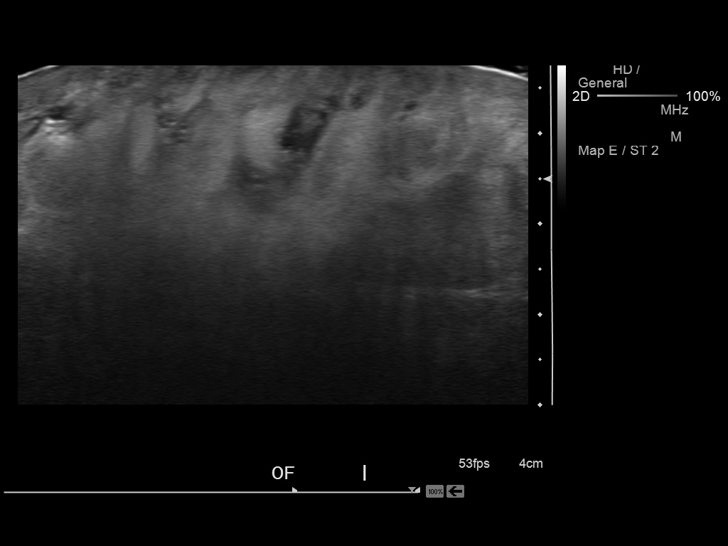
[im 5/24]
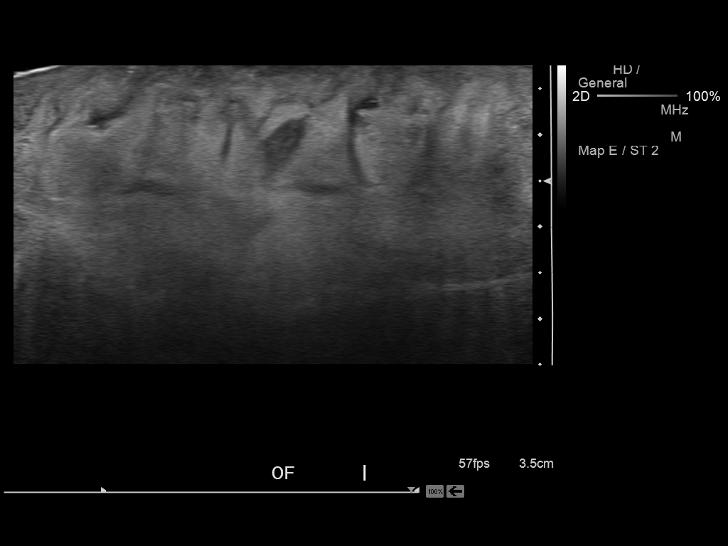
[im 7/24]
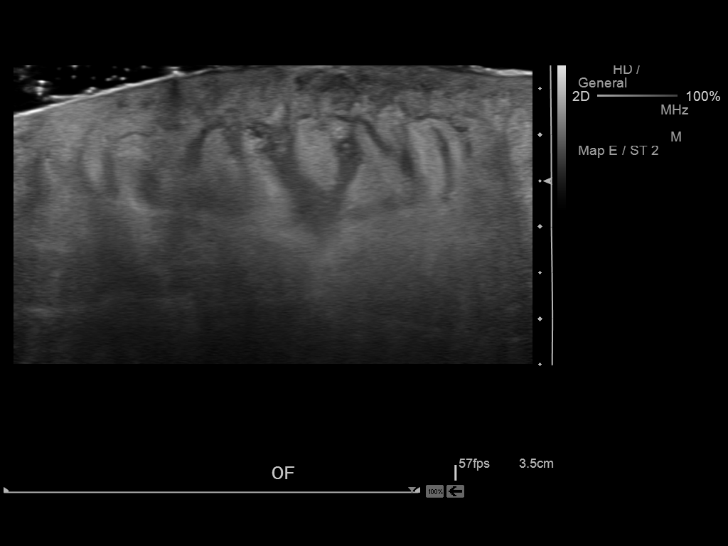
[im 9/24]
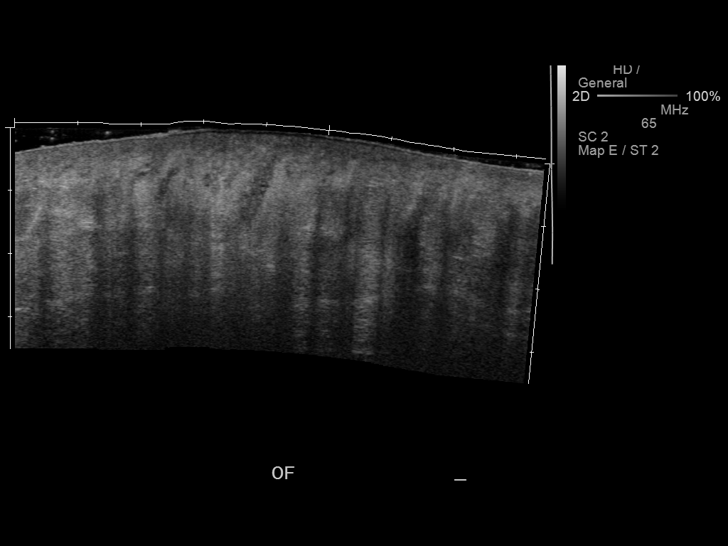
[im 11/24]
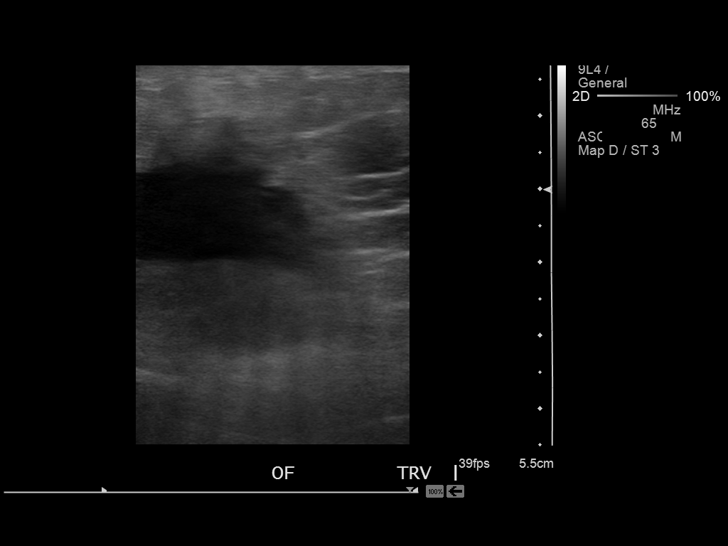
[im 13/24]
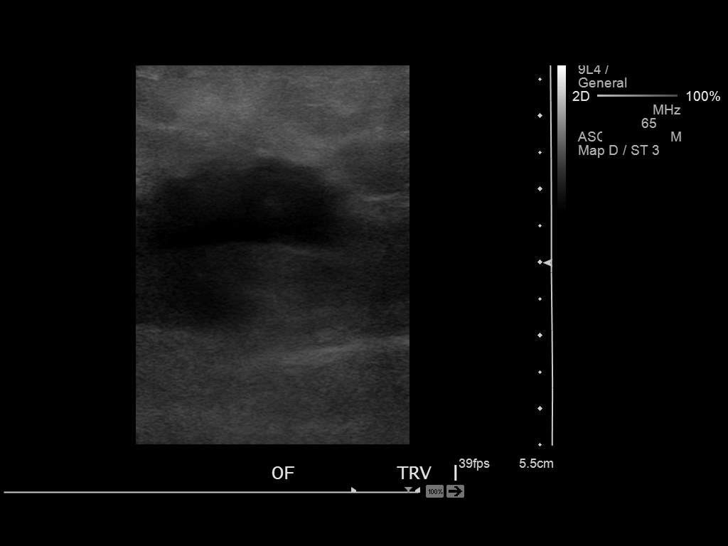
[im 14/24]
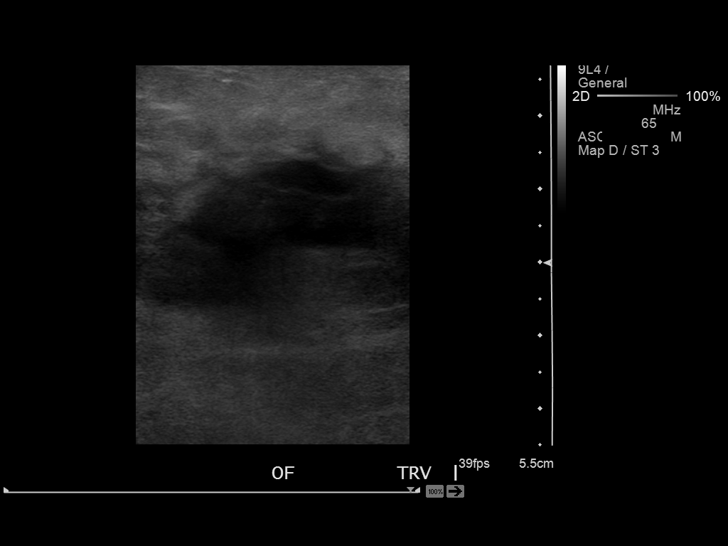
[im 16/24]
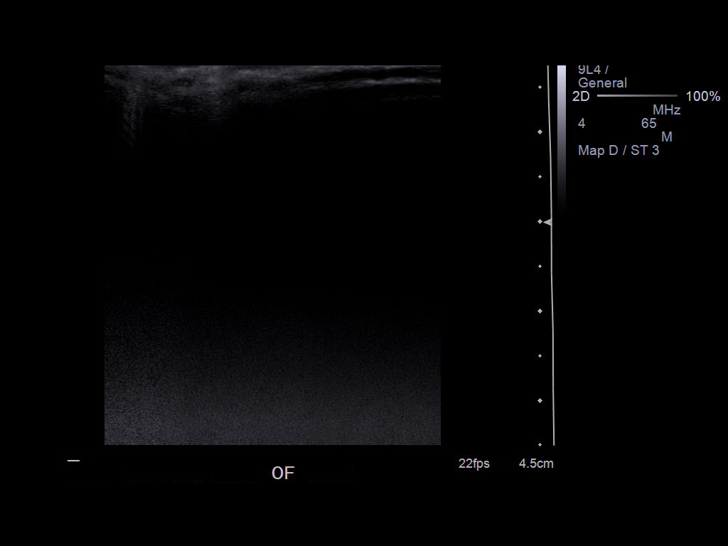
[im 18/24]
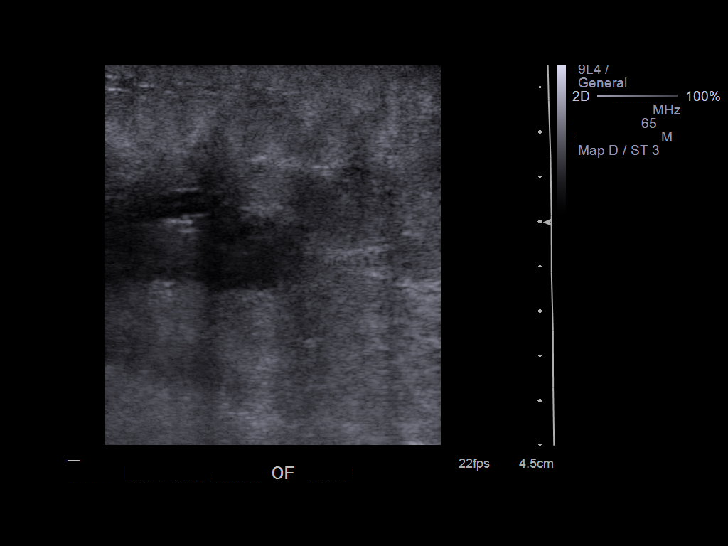
[im 20/24]
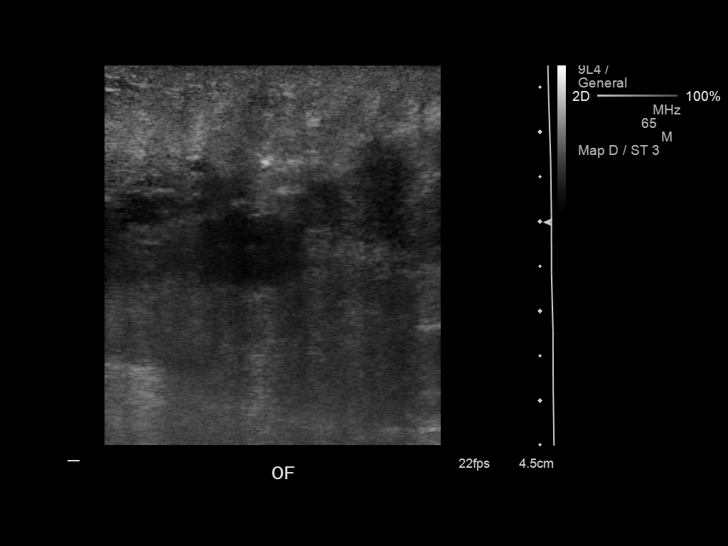
[im 22/24]
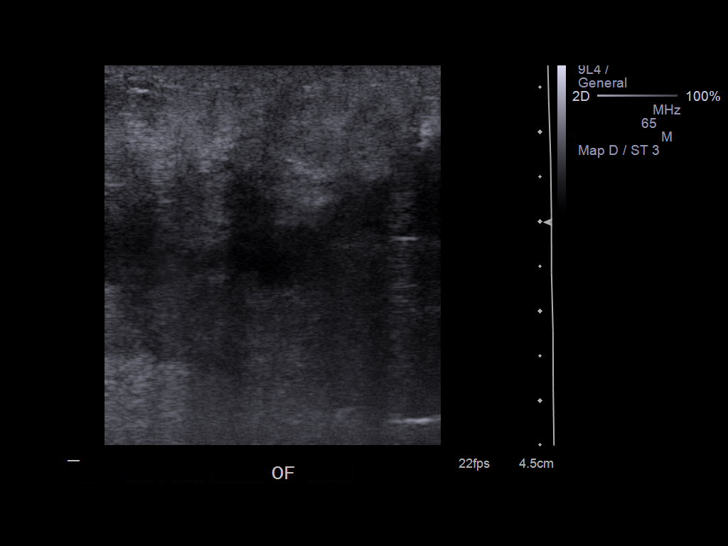
[im 24/24]
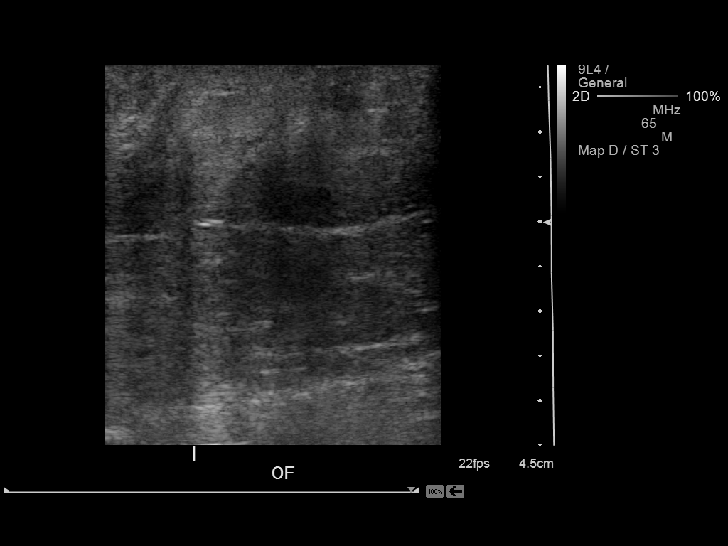

[13 of 24 positions shown; findings below may reference images not displayed]

ULTRASOUND GUIDED DRAINAGE OF LEFT GLUTEAL ABSCESS

Procedure:  The procedure, risks, benefits, and alternatives were
explained to the patient.  Questions regarding the procedure were
encouraged and answered. The patient understands and consents to
the procedure.

The left gluteal region was prepped with Betadine in a sterile
fashion, and a sterile drape was applied covering the operative
field.  A sterile gown and sterile gloves were used for the
procedure. Local anesthesia was provided with 1% Lidocaine.

Ultrasound was performed of the left buttocks area.  Under
ultrasound guidance, a 5-Mmesoma Katt centesis catheter was inserted
into a subcutaneous abscess.  Aspiration of fluid was performed.
The collection was also irrigated with sterile saline and saline
aspirated.  Fluid sample was sent for routine, anaerobic and fungal
culture studies.

Complications: None
FINDINGS: Clinically, the left gluteal site has been draining
cutaneously.  The visible subcutaneous abscess by ultrasound
measures approximately 3 cm in diameter and likely has partially
decompressed since the prior CT.  Aspiration yielded approximately
10 ml of purulent fluid resulting in near complete decompression of
the collection.  Given the small size of the collection as well as
ability to decompress the collection with a centesis catheter,
decision was made today not to place an indwelling drain in the
collection.
IMPRESSION: Percutaneous drainage of left gluteal abscess under ultrasound
guidance with return of 10 ml of purulent fluid.  This resulted [REDACTED]ompression of the collection.  Size of the abscess is smaller
than measure by CT and clinically, there has been cutaneous
drainage of purulent fluid.  The entire aspirated sample was sent
for culture analysis.

## 2013-05-01 ENCOUNTER — Encounter: Payer: Self-pay | Admitting: Cardiovascular Disease

## 2013-05-01 ENCOUNTER — Ambulatory Visit (INDEPENDENT_AMBULATORY_CARE_PROVIDER_SITE_OTHER): Payer: 59 | Admitting: Cardiovascular Disease

## 2013-05-01 VITALS — BP 128/86 | HR 75 | Ht 62.0 in | Wt 121.8 lb

## 2013-05-01 DIAGNOSIS — I498 Other specified cardiac arrhythmias: Secondary | ICD-10-CM

## 2013-05-01 DIAGNOSIS — I471 Supraventricular tachycardia: Secondary | ICD-10-CM

## 2013-05-01 NOTE — Progress Notes (Signed)
    Kelsey Johnson Date of Birth  01-Jul-1971 [facility]     [place] [facility]  [address]    [address]   [address] [place], [place]  [address]    [place], [place]  [address] [phone]  Fax  [phone]  [phone]  Fax [phone]  Problem list: 1. Tachycardia 2. Syncope  History of Present Illness:  Kelsey Johnson is doing well.    She's interested in stopping all of her medications. She's not had any problems.  May 01, 2013:  Kelsey Johnson is doing well.  She has had some neck pain and head ache.  No cardiac complaints or palpitations.   No current outpatient prescriptions on file prior to visit.   No current facility-administered medications on file prior to visit.    Allergies  Allergen Reactions  . Sulfa Antibiotics Other (See Comments)    Reaction unknown child    Past Medical History  Diagnosis Date  . Asthma   . IBS (irritable bowel syndrome)   . FHx: hypertension   . FHx: thyroid disease   . FHx: diabetes mellitus   . FHx: migraine headaches   . H/O candidiasis   . H/O varicella   . Abnormal Pap smear 2011  . CTIN (chronic tubulo-interstitial nephritis)   . Kleine-Levin syndrome 2002  . AMA (advanced maternal age) multigravida 35+   . Dysrhythmia SVT  . Supraventricular tachycardia 2011    Dr. Gladis Riffle  . Urinary urgency 2004  . ASCUS (atypical squamous cells of undetermined significance) on Pap smear 2004    Negative for HPV  . H/O vaginal bleeding 2005    x 8 weeks  . Monilial vulvitis 2006  . H/O vaginitis 2008  . Hx: UTI (urinary tract infection) 08/17/11    Past Surgical History  Procedure Laterality Date  . Tympanostomy tube placement      As a child  . Adenoidectomy      As a child    History  Smoking status  . Never Smoker   Smokeless tobacco  . Never Used    History  Alcohol Use No    Family History  Problem Relation Age of Onset  . Early death Father   . Diabetes Father   . Asthma Daughter   . Heart  disease Maternal Grandfather     MI  . Diabetes Paternal Grandfather     Reviw of Systems:  Reviewed in the HPI.  All other systems are negative.  Physical Exam: Blood pressure 128/86, pulse 75, height 5\' 2"  (1.575 m), weight 121 lb 12.8 oz (55.248 kg). General: Well developed, well nourished, in no acute distress.  Head: Normocephalic, atraumatic, sclera non-icteric, mucus membranes are moist,   Neck: Supple. Negative for carotid bruits. JVD not elevated.  Lungs: Clear bilaterally to auscultation without wheezes, rales, or rhonchi. Breathing is unlabored.  Heart: RRR with S1 S2. No murmurs, rubs, or gallops appreciated.  Abdomen: Soft, non-tender, non-distended with normoactive bowel sounds. No hepatomegaly. No rebound/guarding. No obvious abdominal masses.  Msk:  Strength and tone appear normal for age.  Extremities: No clubbing or cyanosis. No edema.  Distal pedal pulses are 2+ and equal bilaterally.  Neuro: Alert and oriented X 3. Moves all extremities spontaneously.  Psych:  Responds to questions appropriately with a normal affect.  ECG: May 01, 2013:  NSR at 75 . No ST or T wave changes  Assessment / Plan:

## 2013-05-01 NOTE — Assessment & Plan Note (Signed)
Kelsey Johnson is doing well.  She has not had any palpitations. She does not want to take any medications. At this point I think it she's relatively healthy. She has any further palpitations and we will be glad to see her. Otherwise to come see me on an as-needed basis.

## 2013-05-01 NOTE — Patient Instructions (Addendum)
Your physician recommends that you schedule a follow-up appointment in: AS NEEDED BASIS  Your physician recommends that you continue on your current medications as directed. Please refer to the Current Medication list given to you today.    

## 2013-05-22 ENCOUNTER — Other Ambulatory Visit: Payer: Self-pay | Admitting: Obstetrics and Gynecology

## 2013-05-22 DIAGNOSIS — Z1231 Encounter for screening mammogram for malignant neoplasm of breast: Secondary | ICD-10-CM

## 2013-05-28 ENCOUNTER — Ambulatory Visit (HOSPITAL_COMMUNITY)
Admission: RE | Admit: 2013-05-28 | Discharge: 2013-05-28 | Disposition: A | Discharge status: Home / Self Care | Payer: 59 | Source: Ambulatory Visit | Attending: Obstetrics and Gynecology | Admitting: Obstetrics and Gynecology

## 2013-05-28 DIAGNOSIS — Z1231 Encounter for screening mammogram for malignant neoplasm of breast: Secondary | ICD-10-CM | POA: Insufficient documentation

## 2013-08-09 ENCOUNTER — Other Ambulatory Visit: Payer: Self-pay

## 2014-04-30 ENCOUNTER — Other Ambulatory Visit: Payer: Self-pay | Admitting: Obstetrics and Gynecology

## 2014-04-30 DIAGNOSIS — Z1231 Encounter for screening mammogram for malignant neoplasm of breast: Secondary | ICD-10-CM

## 2014-05-29 ENCOUNTER — Ambulatory Visit (HOSPITAL_COMMUNITY)
Admission: RE | Admit: 2014-05-29 | Discharge: 2014-05-29 | Disposition: A | Discharge status: Home / Self Care | Payer: 59 | Source: Ambulatory Visit | Attending: Obstetrics and Gynecology | Admitting: Obstetrics and Gynecology

## 2014-05-29 DIAGNOSIS — Z1231 Encounter for screening mammogram for malignant neoplasm of breast: Secondary | ICD-10-CM | POA: Diagnosis present

## 2014-08-05 ENCOUNTER — Encounter: Payer: Self-pay | Admitting: Cardiovascular Disease

## 2014-09-02 ENCOUNTER — Ambulatory Visit (INDEPENDENT_AMBULATORY_CARE_PROVIDER_SITE_OTHER): Payer: 59 | Admitting: Cardiology

## 2014-09-02 ENCOUNTER — Encounter: Payer: Self-pay | Admitting: Cardiology

## 2014-09-02 VITALS — BP 110/64 | HR 99 | Ht 62.0 in | Wt 117.0 lb

## 2014-09-02 DIAGNOSIS — Q238 Other congenital malformations of aortic and mitral valves: Secondary | ICD-10-CM

## 2014-09-02 DIAGNOSIS — R002 Palpitations: Secondary | ICD-10-CM

## 2014-09-02 DIAGNOSIS — Z79899 Other long term (current) drug therapy: Secondary | ICD-10-CM

## 2014-09-02 DIAGNOSIS — I471 Supraventricular tachycardia: Secondary | ICD-10-CM

## 2014-09-02 DIAGNOSIS — Q248 Other specified congenital malformations of heart: Secondary | ICD-10-CM

## 2014-09-02 DIAGNOSIS — R5383 Other fatigue: Secondary | ICD-10-CM

## 2014-09-02 LAB — BASIC METABOLIC PANEL
BUN: 10 mg/dL (ref 6–23)
CALCIUM: 9.6 mg/dL (ref 8.4–10.5)
CHLORIDE: 103 meq/L (ref 96–112)
CO2: 27 meq/L (ref 19–32)
CREATININE: 0.76 mg/dL (ref 0.50–1.10)
Glucose, Bld: 92 mg/dL (ref 70–99)
Potassium: 4.3 mEq/L (ref 3.5–5.3)
Sodium: 139 mEq/L (ref 135–145)

## 2014-09-02 LAB — CBC
HCT: 43.6 % (ref 36.0–46.0)
HEMOGLOBIN: 14.8 g/dL (ref 12.0–15.0)
MCH: 30.8 pg (ref 26.0–34.0)
MCHC: 33.9 g/dL (ref 30.0–36.0)
MCV: 90.8 fL (ref 78.0–100.0)
MPV: 10.2 fL (ref 9.4–12.4)
Platelets: 319 10*3/uL (ref 150–400)
RBC: 4.8 MIL/uL (ref 3.87–5.11)
RDW: 12.3 % (ref 11.5–15.5)
WBC: 5.5 10*3/uL (ref 4.0–10.5)

## 2014-09-02 LAB — TSH: TSH: 1.118 u[IU]/mL (ref 0.350–4.500)

## 2014-09-02 LAB — MAGNESIUM: Magnesium: 2.2 mg/dL (ref 1.5–2.5)

## 2014-09-02 MED ORDER — METOPROLOL SUCCINATE ER 25 MG PO TB24: 25.0000 mg | ORAL_TABLET | ORAL | Status: DC | PRN

## 2014-09-02 NOTE — Assessment & Plan Note (Signed)
No awareness of Tachycardia.

## 2014-09-02 NOTE — Assessment & Plan Note (Signed)
SWill check BMP, TSH, CBC and Mg+.  Check echo to insure MV without change, also add toprol XL 25 mg prn palpitations, if they occur take 2 days in a row.  Then ok to stop if they have resolved.  We discussed monitor, she has worn one in the past.  Will hold off for now, only plan if palpitations continues despite stable labs.  She will follow up with Dr. Elease HashimotoNahser in 5-6 weeks.

## 2014-09-02 NOTE — Progress Notes (Signed)
09/02/2014   PCP: Pcp Not In System   Chief Complaint  Patient presents with  . Irregular Heart Beat    "feels as thought a beat is skipped", worse laying on left side     Primary Cardiologist: Dr. Elease HashimotoNahser  HPI:  43 year old female with hx SVT, had been on labetalol/ toprol.  Stopped July of 2014.  Had been doing well until last 1.5 weeks. Now irregular HR.  No fast HR, just irregular.  She has PACs on EKG.  The palpitations have resolved at current time.  She drinks several glasses of tea a day, have asked her to reduce to 2 glasses/cups per day.  She does not use over the counter meds.  Stress Echo done in 2011.  No chest pain, no SOB.  None to very mild dizziness.   Allergies  Allergen Reactions  . Sulfa Antibiotics Other (See Comments)    Reaction unknown child    Current Outpatient Prescriptions  Medication Sig Dispense Refill  . LO LOESTRIN FE 1 MG-10 MCG / 10 MCG tablet     . metoprolol succinate (TOPROL XL) 25 MG 24 hr tablet Take 1 tablet (25 mg total) by mouth as needed. Take at least 1 day following PRN dose 30 tablet 3   No current facility-administered medications for this visit.    Past Medical History  Diagnosis Date  . Asthma   . IBS (irritable bowel syndrome)   . FHx: hypertension   . FHx: thyroid disease   . FHx: diabetes mellitus   . FHx: migraine headaches   . H/O candidiasis   . H/O varicella   . Abnormal Pap smear 2011  . CTIN (chronic tubulo-interstitial nephritis)   . Kleine-Levin syndrome 2002  . AMA (advanced maternal age) multigravida 35+   . Dysrhythmia SVT  . Supraventricular tachycardia 2011    Dr. Gladis RiffleNahasser  . Urinary urgency 2004  . ASCUS (atypical squamous cells of undetermined significance) on Pap smear 2004    Negative for HPV  . H/O vaginal bleeding 2005    x 8 weeks  . Monilial vulvitis 2006  . H/O vaginitis 2008  . Hx: UTI (urinary tract infection) 08/17/11    Past Surgical History  Procedure Laterality  Date  . Tympanostomy tube placement      As a child  . Adenoidectomy      As a child    ZOX:WRUEAVW:UJROS:General:no colds or fevers, no weight changes Skin:no rashes or ulcers HEENT:no blurred vision, no congestion CV:see HPI PUL:see HPI GI:no diarrhea constipation or melena, no indigestion GU:no hematuria, no dysuria MS:no joint pain, no claudication Neuro:no syncope, no lightheadedness Endo:no diabetes, no thyroid disease  Wt Readings from Last 3 Encounters:  09/02/14 117 lb (53.071 kg)  05/01/13 121 lb 12.8 oz (55.248 kg)  04/07/12 114 lb (51.71 kg)    PHYSICAL EXAM BP 110/64 mmHg  Pulse 99  Ht 5\' 2"  (1.575 m)  Wt 117 lb (53.071 kg)  BMI 21.39 kg/m2 General:Pleasant affect, NAD Skin:Warm and dry, brisk capillary refill HEENT:normocephalic, sclera clear, mucus membranes moist Neck:supple, no JVD, no bruits, no thyromegaly   Heart:S1S2 RRR without murmur, gallup, rub or + click Lungs:clear without rales, rhonchi, or wheezes WJX:BJYNAbd:soft, non tender, + BS, do not palpate liver spleen or masses Ext:no lower ext edema, 2+ pedal pulses, 2+ radial pulses Neuro:alert and oriented X 3, MAE, follows commands, + facial symmetry  EKG:SR at 99 with PACs, no  acute changes otherwise.    ASSESSMENT AND PLAN Heart palpitations, not rapid SWill check BMP, TSH, CBC and Mg+.  Check echo to insure MV without change, also add toprol XL 25 mg prn palpitations, if they occur take 2 days in a row.  Then ok to stop if they have resolved.  We discussed monitor, she has worn one in the past.  Will hold off for now, only plan if palpitations continues despite stable labs.  She will follow up with Dr. Elease HashimotoNahser in 5-6 weeks.  Abnormality of mitral valve annulus Last echo in 2011, with mild ant. Leaflet abnormality but not true MVP. Will recheck.  SVT (supraventricular tachycardia) No awareness of Tachycardia.

## 2014-09-02 NOTE — Patient Instructions (Signed)
Your physician recommends that you return for lab work   Your physician has requested that you have an echocardiogram. Echocardiography is a painless test that uses sound waves to create images of your heart. It provides your doctor with information about the size and shape of your heart and how well your heart's chambers and valves are working. This procedure takes approximately one hour. There are no restrictions for this procedure.  Your physician recommends that you schedule a follow-up appointment in:4-5 weeks with Dr.Nahser

## 2014-09-02 NOTE — Assessment & Plan Note (Signed)
Last echo in 2011, with mild ant. Leaflet abnormality but not true MVP. Will recheck.

## 2014-09-09 ENCOUNTER — Ambulatory Visit (HOSPITAL_COMMUNITY)
Admission: RE | Admit: 2014-09-09 | Discharge: 2014-09-09 | Disposition: A | Discharge status: Home / Self Care | Payer: 59 | Source: Ambulatory Visit | Attending: Cardiology | Admitting: Cardiology

## 2014-09-09 DIAGNOSIS — R002 Palpitations: Secondary | ICD-10-CM

## 2014-09-09 DIAGNOSIS — R072 Precordial pain: Secondary | ICD-10-CM

## 2014-09-09 DIAGNOSIS — R0789 Other chest pain: Secondary | ICD-10-CM | POA: Diagnosis not present

## 2014-09-09 NOTE — Progress Notes (Signed)
2D Echo Performed 10/30/2013    Azeez Dunker, RCS  

## 2014-10-11 ENCOUNTER — Ambulatory Visit: Payer: 59 | Admitting: Cardiovascular Disease

## 2014-11-01 ENCOUNTER — Encounter: Payer: Self-pay | Admitting: Cardiovascular Disease

## 2015-05-12 ENCOUNTER — Other Ambulatory Visit (HOSPITAL_COMMUNITY): Payer: Self-pay | Admitting: Obstetrics and Gynecology

## 2015-05-12 DIAGNOSIS — Z1231 Encounter for screening mammogram for malignant neoplasm of breast: Secondary | ICD-10-CM

## 2015-06-02 ENCOUNTER — Ambulatory Visit (HOSPITAL_COMMUNITY)
Admission: RE | Admit: 2015-06-02 | Discharge: 2015-06-02 | Disposition: A | Discharge status: Home / Self Care | Payer: 59 | Source: Ambulatory Visit | Attending: Obstetrics and Gynecology | Admitting: Obstetrics and Gynecology

## 2015-06-02 DIAGNOSIS — Z1231 Encounter for screening mammogram for malignant neoplasm of breast: Secondary | ICD-10-CM | POA: Diagnosis not present

## 2016-04-14 ENCOUNTER — Telehealth: Payer: Self-pay | Admitting: Cardiovascular Disease

## 2016-04-14 ENCOUNTER — Ambulatory Visit (INDEPENDENT_AMBULATORY_CARE_PROVIDER_SITE_OTHER): Payer: 59 | Admitting: Cardiovascular Disease

## 2016-04-14 ENCOUNTER — Encounter: Payer: Self-pay | Admitting: Cardiovascular Disease

## 2016-04-14 VITALS — BP 110/80 | HR 73 | Ht 62.0 in | Wt 120.4 lb

## 2016-04-14 DIAGNOSIS — R002 Palpitations: Secondary | ICD-10-CM | POA: Diagnosis not present

## 2016-04-14 DIAGNOSIS — I471 Supraventricular tachycardia: Secondary | ICD-10-CM

## 2016-04-14 MED ORDER — PROPRANOLOL HCL 10 MG PO TABS: 10.0000 mg | ORAL_TABLET | Freq: Four times a day (QID) | ORAL | Status: DC | PRN

## 2016-04-14 NOTE — Telephone Encounter (Signed)
Patient called back to report that she was taking Toprol XL on an as needed basis.  She states Dr. Elease HashimotoNahser advised her to use propranolol 10 mg up to 4 times per day as needed instead as she reported to him that she does not feel like she needs to take the toprol daily.  I have discontinued the toprol Rx and have sent the propranolol Rx to [facility] [address] per patient request.  She thanked me for the call.

## 2016-04-14 NOTE — Telephone Encounter (Signed)
New message      Pt was just seen.  Calling to give medication info.  She takes metoprolol 25mg .  The directions on bottle says take 1 tab as needed; Take at least 1 day following as needed dose.  Please call in presc to [facility].  This is a new pharmacy

## 2016-04-14 NOTE — Patient Instructions (Signed)
Medication Instructions:  Your physician recommends that you continue on your current medications as directed. Please refer to the Current Medication list given to you today.   Labwork: None Ordered   Testing/Procedures: None Ordered   Follow-Up: Your physician wants you to follow-up in: 1 year with Dr. Nahser.  You will receive a reminder letter in the mail two months in advance. If you don't receive a letter, please call our office to schedule the follow-up appointment.   If you need a refill on your cardiac medications before your next appointment, please call your pharmacy.   Thank you for choosing [facility]! Maysel Mccolm, RN [phone]    

## 2016-04-14 NOTE — Progress Notes (Signed)
Kelsey Johnson Date of Birth  07/04/1971   Problem list: 1. Tachycardia 2. Syncope  History of Present Illness:  Kelsey Johnson is doing well.    She's interested in stopping all of her medications. She's not had any problems.  May 01, 2013:  Kelsey Johnson is doing well.  She has had some neck pain and head ache.  No cardiac complaints or palpitations.   April 14, 2016: Kelsey Johnson is seen back after a 3 year absence . Has had some issues with palpitations .   Takes the Propranolol or Metoprolol  as needed.  Has palpitations - off and on during the day  Busy , Active,  Walks the dog , not much exercise otherwise    Current Outpatient Prescriptions on File Prior to Visit  Medication Sig Dispense Refill  . LO LOESTRIN FE 1 MG-10 MCG / 10 MCG tablet Take 1 tablet by mouth daily.     . metoprolol succinate (TOPROL XL) 25 MG 24 hr tablet Take 1 tablet (25 mg total) by mouth as needed. Take at least 1 day following PRN dose (Patient taking differently: Take 25 mg by mouth as needed (AS NEEDED FOR PALPITATIONS). Take at least 1 day following PRN dose) 30 tablet 3   No current facility-administered medications on file prior to visit.    Allergies  Allergen Reactions  . Sulfa Antibiotics Other (See Comments)    Reaction unknown child    Past Medical History  Diagnosis Date  . Asthma   . IBS (irritable bowel syndrome)   . FHx: hypertension   . FHx: thyroid disease   . FHx: diabetes mellitus   . FHx: migraine headaches   . H/O candidiasis   . H/O varicella   . Abnormal Pap smear 2011  . CTIN (chronic tubulo-interstitial nephritis)   . Kleine-Levin syndrome 2002  . AMA (advanced maternal age) multigravida 35+   . Dysrhythmia SVT  . Supraventricular tachycardia ([facility]) 2011    Dr. Elease HashimotoNahser  . Urinary urgency 2004  . ASCUS (atypical squamous cells of undetermined significance) on Pap smear 2004    Negative for HPV  . H/O vaginal bleeding 2005    x 8 weeks  . Monilial vulvitis 2006  . H/O  vaginitis 2008  . Hx: UTI (urinary tract infection) 08/17/11    Past Surgical History  Procedure Laterality Date  . Tympanostomy tube placement      As a child  . Adenoidectomy      As a child    History  Smoking status  . Never Smoker   Smokeless tobacco  . Never Used    History  Alcohol Use No    Family History  Problem Relation Age of Onset  . Early death Father   . Diabetes Father   . Asthma Daughter   . Heart disease Maternal Grandfather     MI  . Diabetes Paternal Grandfather     Reviw of Systems:  Reviewed in the HPI.  All other systems are negative.  Physical Exam: Blood pressure 110/80, pulse 73, height 5\' 2"  (1.575 m), weight 120 lb 6.4 oz (54.613 kg). General: Well developed, well nourished, in no acute distress.  Head: Normocephalic, atraumatic, sclera non-icteric, mucus membranes are moist,   Neck: Supple. Negative for carotid bruits. JVD not elevated.  Lungs: Clear bilaterally to auscultation without wheezes, rales, or rhonchi. Breathing is unlabored.  Heart: RRR with S1 S2. No murmurs, rubs, or gallops appreciated.  Abdomen: Soft, non-tender, non-distended with  normoactive bowel sounds. No hepatomegaly. No rebound/guarding. No obvious abdominal masses.  Msk:  Strength and tone appear normal for age.  Extremities: No clubbing or cyanosis. No edema.  Distal pedal pulses are 2+ and equal bilaterally.  Neuro: Alert and oriented X 3. Moves all extremities spontaneously.  Psych:  Responds to questions appropriately with a normal affect.  ECG: April 14, 2016:   NSR at 65.   No ST or T wave abn.   Assessment / Plan:   1. Palpitations: Kelsey Johnson  presents with recurrent palpitations. She's been taking Toprol-XL on an as-needed basis. I have given her propranolol in the past. We will refill the propranolol which she should take on an as needed basis.  I will see her in 1 year.     Kristeen Miss, MD  04/14/2016 2:40 PM    [facility]  [facility] [address] [place],  [address] [place], [place]  [address] Pager [phone] Phone: [phone]; Fax: [phone]

## 2016-11-15 DIAGNOSIS — L820 Inflamed seborrheic keratosis: Secondary | ICD-10-CM | POA: Diagnosis not present

## 2016-11-15 DIAGNOSIS — L719 Rosacea, unspecified: Secondary | ICD-10-CM | POA: Diagnosis not present

## 2016-11-15 DIAGNOSIS — L570 Actinic keratosis: Secondary | ICD-10-CM | POA: Diagnosis not present

## 2016-11-17 DIAGNOSIS — G8929 Other chronic pain: Secondary | ICD-10-CM | POA: Diagnosis not present

## 2016-11-17 DIAGNOSIS — M25561 Pain in right knee: Secondary | ICD-10-CM | POA: Diagnosis not present

## 2016-11-17 DIAGNOSIS — M25562 Pain in left knee: Secondary | ICD-10-CM | POA: Diagnosis not present

## 2016-12-22 DIAGNOSIS — M2550 Pain in unspecified joint: Secondary | ICD-10-CM | POA: Diagnosis not present

## 2016-12-22 DIAGNOSIS — R5382 Chronic fatigue, unspecified: Secondary | ICD-10-CM | POA: Diagnosis not present

## 2016-12-22 DIAGNOSIS — M25562 Pain in left knee: Secondary | ICD-10-CM | POA: Diagnosis not present

## 2016-12-22 DIAGNOSIS — M25561 Pain in right knee: Secondary | ICD-10-CM | POA: Diagnosis not present

## 2016-12-22 DIAGNOSIS — M25521 Pain in right elbow: Secondary | ICD-10-CM | POA: Diagnosis not present

## 2017-01-06 DIAGNOSIS — M2550 Pain in unspecified joint: Secondary | ICD-10-CM | POA: Diagnosis not present

## 2017-01-06 DIAGNOSIS — M25562 Pain in left knee: Secondary | ICD-10-CM | POA: Diagnosis not present

## 2017-01-06 DIAGNOSIS — M25561 Pain in right knee: Secondary | ICD-10-CM | POA: Diagnosis not present

## 2017-04-11 DIAGNOSIS — Z01419 Encounter for gynecological examination (general) (routine) without abnormal findings: Secondary | ICD-10-CM | POA: Diagnosis not present

## 2017-04-11 DIAGNOSIS — Z6821 Body mass index (BMI) 21.0-21.9, adult: Secondary | ICD-10-CM | POA: Diagnosis not present

## 2017-04-11 DIAGNOSIS — Z124 Encounter for screening for malignant neoplasm of cervix: Secondary | ICD-10-CM | POA: Diagnosis not present

## 2017-04-12 LAB — HM PAP SMEAR: HM Pap smear: NORMAL

## 2017-04-22 ENCOUNTER — Telehealth: Payer: Self-pay | Admitting: Surgical

## 2017-04-22 ENCOUNTER — Encounter: Payer: Self-pay | Admitting: Surgical

## 2017-04-22 NOTE — Telephone Encounter (Signed)
New patient paperwork received and abstracted to chart.

## 2017-04-26 ENCOUNTER — Ambulatory Visit (INDEPENDENT_AMBULATORY_CARE_PROVIDER_SITE_OTHER): Payer: 59 | Admitting: Family Medicine

## 2017-04-26 VITALS — BP 134/82 | HR 84 | Temp 98.0°F | Ht 62.0 in | Wt 122.2 lb

## 2017-04-26 DIAGNOSIS — Z111 Encounter for screening for respiratory tuberculosis: Secondary | ICD-10-CM

## 2017-04-26 DIAGNOSIS — Z0000 Encounter for general adult medical examination without abnormal findings: Secondary | ICD-10-CM | POA: Diagnosis not present

## 2017-04-26 NOTE — Progress Notes (Signed)
Vergie LivingRives M Commisso is a 46 y.o. female [facility]   Patient Care Team: Helane RimaWallace, Nyomie Ehrlich, DO as PCP - General (Family Medicine)   History of Present Illness:   Britt BottomJamie Wheeley CMA acting as scribe for Dr. Earlene PlaterWallace.  HPI: Patient comes in today to Establish care. 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   Health Maintenance Due  Topic Date Due  . PAP SMEAR  03/09/1992   PMHx, SurgHx, SocialHx, Medications, and Allergies were reviewed in the Visit Navigator and updated as appropriate.   Past Medical History:  Diagnosis Date  . Abnormal Pap smear 2011  . AMA (advanced maternal age) multigravida 35+   . ASCUS (atypical squamous cells of undetermined significance) on Pap smear 2004   Negative for HPV  . Asthma   . CTIN (chronic tubulo-interstitial nephritis)   . Dysrhythmia SVT  . FHx: diabetes mellitus   . FHx: hypertension   . FHx: migraine headaches   . FHx: thyroid disease   . H/O candidiasis   . H/O vaginal bleeding 2005   x 8 weeks  . H/O vaginitis 2008  . H/O varicella   . Hx: UTI (urinary tract infection) 08/17/11  . IBS (irritable bowel syndrome)   . Kleine-Levin syndrome 2002  . Monilial vulvitis 2006  . Supraventricular tachycardia (HCC) 2011   Dr. Elease HashimotoNahser  . Urinary urgency 2004    Past Surgical History:  Procedure Laterality Date  . ADENOIDECTOMY     As a child  . TYMPANOSTOMY TUBE PLACEMENT     As a child    Family History  Problem Relation Age of Onset  . Early death Father   . Diabetes Father   . Asthma Daughter   . Heart disease Maternal Grandfather        MI  . Diabetes Paternal Grandfather    Social History  Substance Use Topics  . Smoking status: Never Smoker  . Smokeless tobacco: Never Used  . Alcohol use No   Current Medications and Allergies:   .  LO LOESTRIN FE 1 MG-10 MCG / 10 MCG tablet, Take 1 tablet by mouth daily. , Disp: , Rfl:  .  propranolol (INDERAL) 10 MG tablet, Take 1 tablet (10 mg total) by mouth 4  (four) times daily as needed (Palpitations/fast heart rate)., Disp: 60 tablet, Rfl: 11  Allergies  Allergen Reactions  . Sulfa Antibiotics Other (See Comments)    Reaction unknown child   Review of Systems:   Pertinent items are noted in the HPI. Otherwise, ROS is negative.  Vitals:   Vitals:   04/26/17 1431  BP: 134/82  Pulse: 84  Temp: 98 F (36.7 C)  TempSrc: Oral  SpO2: 97%  Weight: 122 lb 3.2 oz (55.4 kg)  Height: 5\' 2"  (1.575 m)     Body mass index is 22.35 kg/m.  Physical Exam:   Physical Exam  Constitutional: She appears well-developed and well-nourished. No distress.  HENT:  Head: Normocephalic and atraumatic.  Eyes: Pupils are equal, round, and reactive to light. EOM are normal.  Neck: Normal range of motion. Neck supple.  Cardiovascular: Normal rate, regular rhythm, normal heart sounds and intact distal pulses.   Pulmonary/Chest: Effort normal.  Abdominal: Soft.  Skin: Skin is warm.  Psychiatric: She has a normal mood and affect. Her behavior is normal.  Nursing note and vitals reviewed.  Assessment and Plan:   [name] was seen today for establish care.  Diagnoses and  all orders for this visit:  Routine physical examination Comments: Teacher Physical Form completed.  Encounter for PPD test -     PPD    . Reviewed expectations re: course of current medical issues. . Discussed self-management of symptoms. . Outlined signs and symptoms indicating need for more acute intervention. . Patient verbalized understanding and all questions were answered. Marland Kitchen Health Maintenance issues including appropriate healthy diet, exercise, and smoking avoidance were discussed with patient. . See orders for this visit as documented in the electronic medical record. . Patient received an After Visit Summary.  CMA served as Neurosurgeon during this visit. History, Physical, and Plan performed by medical provider. The above documentation has been reviewed and is accurate and  complete. Helane Rima, D.O.  Helane Rima, DO [place] 04/29/2017  Future Appointments Date Time Provider Department Center  06/15/2017 4:20 PM Nahser, Deloris Ping, MD [facility]

## 2017-04-29 ENCOUNTER — Encounter: Payer: Self-pay | Admitting: Family Medicine

## 2017-05-12 ENCOUNTER — Ambulatory Visit (INDEPENDENT_AMBULATORY_CARE_PROVIDER_SITE_OTHER): Payer: 59 | Admitting: Family Medicine

## 2017-05-12 ENCOUNTER — Encounter: Payer: Self-pay | Admitting: Family Medicine

## 2017-05-12 VITALS — BP 118/76 | HR 91 | Temp 98.4°F | Ht 62.0 in | Wt 119.8 lb

## 2017-05-12 DIAGNOSIS — E559 Vitamin D deficiency, unspecified: Secondary | ICD-10-CM

## 2017-05-12 DIAGNOSIS — R5383 Other fatigue: Secondary | ICD-10-CM

## 2017-05-12 LAB — CBC WITH DIFFERENTIAL/PLATELET
Basophils Absolute: 0 10*3/uL (ref 0.0–0.1)
Basophils Relative: 0.4 % (ref 0.0–3.0)
Eosinophils Absolute: 0.2 10*3/uL (ref 0.0–0.7)
Eosinophils Relative: 4.2 % (ref 0.0–5.0)
HCT: 42.9 % (ref 36.0–46.0)
Hemoglobin: 14.4 g/dL (ref 12.0–15.0)
Lymphocytes Relative: 27.2 % (ref 12.0–46.0)
Lymphs Abs: 1.3 10*3/uL (ref 0.7–4.0)
MCHC: 33.5 g/dL (ref 30.0–36.0)
MCV: 94.3 fl (ref 78.0–100.0)
Monocytes Absolute: 0.3 10*3/uL (ref 0.1–1.0)
Monocytes Relative: 5.3 % (ref 3.0–12.0)
Neutro Abs: 3 10*3/uL (ref 1.4–7.7)
Neutrophils Relative %: 62.9 % (ref 43.0–77.0)
Platelets: 281 10*3/uL (ref 150.0–400.0)
RBC: 4.55 Mil/uL (ref 3.87–5.11)
RDW: 12.1 % (ref 11.5–15.5)
WBC: 4.8 10*3/uL (ref 4.0–10.5)

## 2017-05-12 LAB — COMPREHENSIVE METABOLIC PANEL
ALT: 25 U/L (ref 0–35)
AST: 19 U/L (ref 0–37)
Albumin: 4.4 g/dL (ref 3.5–5.2)
Alkaline Phosphatase: 53 U/L (ref 39–117)
BUN: 13 mg/dL (ref 6–23)
CO2: 30 mEq/L (ref 19–32)
Calcium: 9.2 mg/dL (ref 8.4–10.5)
Chloride: 105 mEq/L (ref 96–112)
Creatinine, Ser: 0.87 mg/dL (ref 0.40–1.20)
GFR: 74.45 mL/min (ref >60.00)
Glucose, Bld: 85 mg/dL (ref 70–99)
Potassium: 4.6 mEq/L (ref 3.5–5.1)
Sodium: 138 mEq/L (ref 135–145)
Total Bilirubin: 0.6 mg/dL (ref 0.2–1.2)
Total Protein: 7 g/dL (ref 6.0–8.3)

## 2017-05-12 LAB — LIPID PANEL
Cholesterol: 150 mg/dL (ref 0–200)
HDL: 40.3 mg/dL (ref >39.00)
LDL Cholesterol: 92 mg/dL (ref 0–99)
NonHDL: 109.28
Total CHOL/HDL Ratio: 4
Triglycerides: 88 mg/dL (ref 0.0–149.0)
VLDL: 17.6 mg/dL (ref 0.0–40.0)

## 2017-05-12 LAB — VITAMIN D 25 HYDROXY (VIT D DEFICIENCY, FRACTURES): VITD: 48.57 ng/mL (ref 30.00–100.00)

## 2017-05-12 LAB — TSH: TSH: 1.29 u[IU]/mL (ref 0.35–4.50)

## 2017-05-12 LAB — VITAMIN B12: Vitamin B-12: 308 pg/mL (ref 211–911)

## 2017-05-12 NOTE — Progress Notes (Signed)
Kelsey Johnson is a 46 y.o. female is here for follow up.  History of Present Illness:   Britt BottomJamie Wheeley CMA acting as scribe for Dr. Earlene PlaterWallace.  HPI: Patient comes in today to have fasting labs drawn. No concerns. Scheduling error - patient should have come in for labs only visit.   Health Maintenance Due  Topic Date Due  . INFLUENZA VACCINE  05/04/2017   PMHx, SurgHx, SocialHx, FamHx, Medications, and Allergies were reviewed in the Visit Navigator and updated as appropriate.   Patient Active Problem List   Diagnosis Date Noted  . 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 Painful sexual intercourse 04/07/2012  . Asthma 07/27/2011  . IBS (irritable bowel syndrome) 07/27/2011  . SVT (supraventricular tachycardia) (HCC) 07/27/2011   Social History  Substance Use Topics  . Smoking status: Never Smoker  . Smokeless tobacco: Never Used  . Alcohol use No   Current Medications and Allergies:   Current Outpatient Prescriptions:  .  celecoxib (CELEBREX) 200 MG capsule, Take 200 mg by mouth 2 (two) times daily., Disp: , Rfl:  .  LO LOESTRIN FE 1 MG-10 MCG / 10 MCG tablet, Take 1 tablet by mouth daily. , Disp: , Rfl:  .  propranolol (INDERAL) 10 MG tablet, Take 1 tablet (10 mg total) by mouth 4 (four) times daily as needed (Palpitations/fast heart rate)., Disp: 60 tablet, Rfl: 11  Allergies  Allergen Reactions  . Sulfa Antibiotics Other (See Comments)    Reaction unknown child   Review of Systems   Pertinent items are noted in the HPI. Otherwise, ROS is negative.  Vitals:   Vitals:   05/12/17 0752  BP: 118/76  Pulse: 91  Temp: 98.4 F (36.9 C)  TempSrc: Oral  SpO2: 97%  Weight: 119 lb 12.8 oz (54.3 kg)  Height: 5\' 2"  (1.575 m)     Body mass index is 21.91 kg/m.   Physical Exam:   Physical Exam  Nursing note and vitals reviewed.   Results for orders placed or performed in visit on 05/12/17  CBC with Differential/Platelet  Result Value Ref Range   WBC  4.8 4.0 - 10.5 K/uL   RBC 4.55 3.87 - 5.11 Mil/uL   Hemoglobin 14.4 12.0 - 15.0 g/dL   HCT 16.142.9 09.636.0 - 04.546.0 %   MCV 94.3 78.0 - 100.0 fl   MCHC 33.5 30.0 - 36.0 g/dL   RDW 40.912.1 81.111.5 - 91.415.5 %   Platelets 281.0 150.0 - 400.0 K/uL   Neutrophils Relative % 62.9 43.0 - 77.0 %   Lymphocytes Relative 27.2 12.0 - 46.0 %   Monocytes Relative 5.3 3.0 - 12.0 %   Eosinophils Relative 4.2 0.0 - 5.0 %   Basophils Relative 0.4 0.0 - 3.0 %   Neutro Abs 3.0 1.4 - 7.7 K/uL   Lymphs Abs 1.3 0.7 - 4.0 K/uL   Monocytes Absolute 0.3 0.1 - 1.0 K/uL   Eosinophils Absolute 0.2 0.0 - 0.7 K/uL   Basophils Absolute 0.0 0.0 - 0.1 K/uL  Comprehensive metabolic panel  Result Value Ref Range   Sodium 138 135 - 145 mEq/L   Potassium 4.6 3.5 - 5.1 mEq/L   Chloride 105 96 - 112 mEq/L   CO2 30 19 - 32 mEq/L   Glucose, Bld 85 70 - 99 mg/dL   BUN 13 6 - 23 mg/dL   Creatinine, Ser 7.820.87 0.40 - 1.20 mg/dL   Total Bilirubin 0.6 0.2 - 1.2 mg/dL   Alkaline Phosphatase 53  39 - 117 U/L   AST 19 0 - 37 U/L   ALT 25 0 - 35 U/L   Total Protein 7.0 6.0 - 8.3 g/dL   Albumin 4.4 3.5 - 5.2 g/dL   Calcium 9.2 8.4 - 09.8 mg/dL   GFR 11.91 >47.82 mL/min  Lipid panel  Result Value Ref Range   Cholesterol 150 0 - 200 mg/dL   Triglycerides 95.6 0.0 - 149.0 mg/dL   HDL 21.30 >86.57 mg/dL   VLDL 84.6 0.0 - 96.2 mg/dL   LDL Cholesterol 92 0 - 99 mg/dL   Total CHOL/HDL Ratio 4    NonHDL 109.28   TSH  Result Value Ref Range   TSH 1.29 0.35 - 4.50 uIU/mL  Vitamin B12  Result Value Ref Range   Vitamin B-12 308 211 - 911 pg/mL  VITAMIN D 25 Hydroxy (Vit-D Deficiency, Fractures)  Result Value Ref Range   VITD 48.57 30.00 - 100.00 ng/mL   Assessment and Plan:   Labs above. Will be discussed with patient. No charge visit.

## 2017-05-17 DIAGNOSIS — M1990 Unspecified osteoarthritis, unspecified site: Secondary | ICD-10-CM | POA: Diagnosis not present

## 2017-05-17 DIAGNOSIS — M2550 Pain in unspecified joint: Secondary | ICD-10-CM | POA: Diagnosis not present

## 2017-05-17 DIAGNOSIS — R5382 Chronic fatigue, unspecified: Secondary | ICD-10-CM | POA: Diagnosis not present

## 2017-05-19 ENCOUNTER — Encounter: Payer: Self-pay | Admitting: Family Medicine

## 2017-05-19 DIAGNOSIS — M1990 Unspecified osteoarthritis, unspecified site: Secondary | ICD-10-CM | POA: Insufficient documentation

## 2017-05-19 DIAGNOSIS — R5382 Chronic fatigue, unspecified: Secondary | ICD-10-CM | POA: Insufficient documentation

## 2017-05-19 DIAGNOSIS — M2550 Pain in unspecified joint: Secondary | ICD-10-CM | POA: Insufficient documentation

## 2017-05-25 ENCOUNTER — Encounter: Payer: Self-pay | Admitting: Cardiovascular Disease

## 2017-06-15 ENCOUNTER — Ambulatory Visit (INDEPENDENT_AMBULATORY_CARE_PROVIDER_SITE_OTHER): Payer: 59 | Admitting: Cardiovascular Disease

## 2017-06-15 ENCOUNTER — Encounter: Payer: Self-pay | Admitting: Cardiovascular Disease

## 2017-06-15 VITALS — BP 124/72 | HR 89 | Ht 62.0 in | Wt 120.0 lb

## 2017-06-15 DIAGNOSIS — I471 Supraventricular tachycardia: Secondary | ICD-10-CM

## 2017-06-15 DIAGNOSIS — R002 Palpitations: Secondary | ICD-10-CM | POA: Diagnosis not present

## 2017-06-15 NOTE — Progress Notes (Signed)
Kelsey Johnson Date of Birth  10/30/1970   Problem list: 1. Tachycardia 2. Syncope  History of Present Illness:  Kelsey Johnson is doing well.    She's interested in stopping all of her medications. She's not had any problems.  May 01, 2013:  Kelsey Johnson is doing well.  She has had some neck pain and head ache.  No cardiac complaints or palpitations.   April 14, 2016: Kelsey Johnson is seen back after a 3 year absence . Has had some issues with palpitations .   Takes the Propranolol or Metoprolol  as needed.  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  No significant palpitations. No CP .  Walks every day with the dog.   About 1 mile   Current Outpatient Prescriptions on File Prior to Visit  Medication Sig Dispense Refill  . LO LOESTRIN FE 1 MG-10 MCG / 10 MCG tablet Take 1 tablet by mouth daily.     . propranolol (INDERAL) 10 MG tablet Take 1 tablet (10 mg total) by mouth 4 (four) times daily as needed (Palpitations/fast heart rate). 60 tablet 11   No current facility-administered medications on file prior to visit.     Allergies  Allergen Reactions  . Sulfa Antibiotics Other (See Comments)    Reaction unknown child    Past Medical History:  Diagnosis Date  . Abnormal Pap smear 2011  . AMA (advanced maternal age) multigravida 35+   . ASCUS (atypical squamous cells of undetermined significance) on Pap smear 2004   Negative for HPV  . Asthma   . CTIN (chronic tubulo-interstitial nephritis)   . Dysrhythmia SVT  . FHx: diabetes mellitus   . FHx: hypertension   . FHx: migraine headaches   . FHx: thyroid disease   . H/O candidiasis   . H/O vaginal bleeding 2005   x 8 weeks  . H/O vaginitis 2008  . H/O varicella   . Hx: UTI (urinary tract infection) 08/17/11  . IBS (irritable bowel syndrome)   . Kleine-Levin syndrome 2002  . Monilial vulvitis 2006  .  Supraventricular tachycardia (HCC) 2011   Dr. Elease HashimotoNahser  . Urinary urgency 2004    Past Surgical History:  Procedure Laterality Date  . ADENOIDECTOMY     As a child  . TYMPANOSTOMY TUBE PLACEMENT     As a child    History  Smoking Status  . Never Smoker  Smokeless Tobacco  . Never Used    History  Alcohol Use No    Family History  Problem Relation Age of Onset  . Early death Father   . Diabetes Father   . Asthma Daughter   . Heart disease Maternal Grandfather        MI  . Diabetes Paternal Grandfather     Reviw of Systems:  Reviewed in the HPI.  All other systems are negative.  Physical Exam: Blood pressure 124/72, pulse 89, height 5\' 2"  (1.575 m), weight 120 lb (54.4 kg). General: Well developed, well nourished, in no acute distress.  Head: Normocephalic, atraumatic, sclera non-icteric, mucus membranes are moist,   Neck: Supple. Negative for carotid bruits. JVD not elevated.  Lungs: Clear bilaterally to auscultation without wheezes, rales, or rhonchi. Breathing is unlabored.  Heart: RRR with S1 S2. No murmurs, rubs, or gallops appreciated.  Abdomen: Soft,  non-tender, non-distended with normoactive bowel sounds. No hepatomegaly. No rebound/guarding. No obvious abdominal masses.  Msk:  Strength and tone appear normal for age.  Extremities: No clubbing or cyanosis. No edema.  Distal pedal pulses are 2+ and equal bilaterally.  Neuro: Alert and oriented X 3. Moves all extremities spontaneously.  Psych:  Responds to questions appropriately with a normal affect.  ECG: Sept. 12, 2018:   NSR at 89.  Inc. RBBB  Assessment / Plan:   1. Palpitations:    takes the propranolol as needed.  No changes.  Will see her in 1 year     Kristeen Miss, MD  06/15/2017 4:50 PM    [facility] [address] [place],  [address] [place], [place]  [address] Pager [phone] Phone: [phone]; Fax: [phone]

## 2017-06-15 NOTE — Patient Instructions (Signed)

## 2017-11-11 ENCOUNTER — Encounter: Payer: Self-pay | Admitting: Family Medicine

## 2017-11-11 ENCOUNTER — Ambulatory Visit: Payer: 59 | Admitting: Family Medicine

## 2017-11-11 VITALS — BP 116/64 | HR 101 | Temp 98.6°F | Ht 62.0 in | Wt 118.6 lb

## 2017-11-11 DIAGNOSIS — R509 Fever, unspecified: Secondary | ICD-10-CM | POA: Diagnosis not present

## 2017-11-11 DIAGNOSIS — R05 Cough: Secondary | ICD-10-CM | POA: Diagnosis not present

## 2017-11-11 DIAGNOSIS — R52 Pain, unspecified: Secondary | ICD-10-CM

## 2017-11-11 DIAGNOSIS — J101 Influenza due to other identified influenza virus with other respiratory manifestations: Secondary | ICD-10-CM

## 2017-11-11 DIAGNOSIS — R059 Cough, unspecified: Secondary | ICD-10-CM

## 2017-11-11 LAB — POCT INFLUENZA A/B
Influenza A, POC: POSITIVE — AB
Influenza B, POC: NEGATIVE

## 2017-11-11 MED ORDER — OSELTAMIVIR PHOSPHATE 75 MG PO CAPS
75.0000 mg | ORAL_CAPSULE | Freq: Two times a day (BID) | ORAL | 0 refills | Status: DC
Start: 2017-11-11 — End: 2018-05-03

## 2017-11-11 MED ORDER — GUAIFENESIN-CODEINE 100-10 MG/5ML PO SOLN: 5.0000 mL | Freq: Three times a day (TID) | ORAL | 0 refills | Status: DC | PRN

## 2017-11-11 NOTE — Progress Notes (Signed)
    Subjective:  Kelsey Johnson is a 47 y.o. female who presents today for same-day appointment with a chief complaint of cough.   HPI:  Cough, Acute Issue Started yesterday.  Stable over the last several hours.  Associated with fever, myalgias, and fatigue.  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  She did not get her flu shot this year.  Tried taking Advil which helped with the fever.  No other obvious alleviating or aggravating factors.  ROS: Per HPI  PMH: She reports that  has never smoked. she has never used smokeless tobacco. She reports that she does not drink alcohol or use drugs.  Objective:  Physical Exam: BP 116/64 (BP Location: Left Arm, Patient Position: Sitting, Cuff Size: Normal)   Pulse (!) 101   Temp 98.6 F (37 C) (Oral)   Ht 5\' 2"  (1.575 m)   Wt 118 lb 9.6 oz (53.8 kg)   SpO2 98%   BMI 21.69 kg/m   Gen: NAD, resting comfortably HEENT: TMs are clear effusion bilaterally.  Oropharynx erythematous without exudate. CV: RRR with no murmurs appreciated Pulm: NWOB, CTAB with no crackles, wheezes, or rhonchi  Results for orders placed or performed in visit on 11/11/17 (from the past 24 hour(s))  POCT Influenza A/B     Status: Abnormal   Collection Time: 11/11/17 11:26 AM  Result Value Ref Range   Influenza A, POC Positive (A) Negative   Influenza B, POC Negative Negative   Assessment/Plan:  Influenza A Rapid flu positive.  Start Tamiflu.  We will also send in guaifenesin-codeine cough syrup for her cough.  Discussed supportive care.  Encouraged good oral hydration.  Recommended rest over the next several days.  Also recommended over-the-counter Tylenol and/or Motrin as needed for fever and pain.  Return precautions reviewed.  Follow-up as needed.  Katina Degreealeb M. Jimmey RalphParker, MD 11/11/2017 11:28 AM

## 2017-11-11 NOTE — Patient Instructions (Addendum)
Your flu test is positive.  Start the cough syrup and tamiflu.  Please stay well hydrated.  You can take tylenol and/or motrin as needed for low grade fever and pain.  Please let me know if your symptoms worsen or fail to improve.  Take care, Dr Jimmey RalphParker

## 2018-01-27 DIAGNOSIS — L718 Other rosacea: Secondary | ICD-10-CM | POA: Diagnosis not present

## 2018-01-27 DIAGNOSIS — B078 Other viral warts: Secondary | ICD-10-CM | POA: Diagnosis not present

## 2018-04-13 DIAGNOSIS — Z01419 Encounter for gynecological examination (general) (routine) without abnormal findings: Secondary | ICD-10-CM | POA: Diagnosis not present

## 2018-04-13 DIAGNOSIS — Z6821 Body mass index (BMI) 21.0-21.9, adult: Secondary | ICD-10-CM | POA: Diagnosis not present

## 2018-04-13 DIAGNOSIS — Z3040 Encounter for surveillance of contraceptives, unspecified: Secondary | ICD-10-CM | POA: Diagnosis not present

## 2018-04-13 DIAGNOSIS — Z1231 Encounter for screening mammogram for malignant neoplasm of breast: Secondary | ICD-10-CM | POA: Diagnosis not present

## 2018-05-03 ENCOUNTER — Ambulatory Visit (INDEPENDENT_AMBULATORY_CARE_PROVIDER_SITE_OTHER): Payer: 59 | Admitting: Family Medicine

## 2018-05-03 ENCOUNTER — Encounter: Payer: Self-pay | Admitting: Family Medicine

## 2018-05-03 VITALS — BP 120/68 | HR 89 | Temp 98.6°F | Ht 62.0 in | Wt 118.2 lb

## 2018-05-03 DIAGNOSIS — Z0000 Encounter for general adult medical examination without abnormal findings: Secondary | ICD-10-CM

## 2018-05-03 DIAGNOSIS — K580 Irritable bowel syndrome with diarrhea: Secondary | ICD-10-CM | POA: Diagnosis not present

## 2018-05-03 MED ORDER — DICYCLOMINE HCL 10 MG PO CAPS: 10.0000 mg | ORAL_CAPSULE | Freq: Three times a day (TID) | ORAL | 1 refills | Status: DC

## 2018-05-03 NOTE — Progress Notes (Signed)
Subjective:    Kelsey Johnson is a 47 y.o. female who presents today for her Complete Annual Exam.   Current Outpatient Medications:  .  LO LOESTRIN FE 1 MG-10 MCG / 10 MCG tablet, Take 1 tablet by mouth daily. , Disp: , Rfl:  .  propranolol (INDERAL) 10 MG tablet, Take 1 tablet (10 mg total) by mouth 4 (four) times daily as needed (Palpitations/fast heart rate)., Disp: 60 tablet, Rfl: 11  There are no preventive care reminders to display for this patient.  PMHx, SurgHx, SocialHx, Medications, and Allergies were reviewed in the Visit Navigator and updated as appropriate.   Past Medical History:  Diagnosis Date  . Abnormal Pap smear 2011  . AMA (advanced maternal age) multigravida 35+   . ASCUS (atypical squamous cells of undetermined significance) on Pap smear 2004   Negative for HPV  . Asthma   . CTIN (chronic tubulo-interstitial nephritis)   . Dysrhythmia SVT  . FHx: diabetes mellitus   . FHx: hypertension   . FHx: migraine headaches   . FHx: thyroid disease   . H/O candidiasis   . H/O vaginal bleeding 2005   x 8 weeks  . H/O vaginitis 2008  . H/O varicella   . Hx: UTI (urinary tract infection) 08/17/11  . IBS (irritable bowel syndrome)   . Kleine-Levin syndrome 2002  . Monilial vulvitis 2006  . Supraventricular tachycardia (HCC) 2011   Dr. Elease Hashimoto  . Urinary urgency 2004     Past Surgical History:  Procedure Laterality Date  . ADENOIDECTOMY     As a child  . TYMPANOSTOMY TUBE PLACEMENT     As a child     Family History  Problem Relation Age of Onset  . Early death Father   . Diabetes Father   . Asthma Daughter   . Heart disease Maternal Grandfather        MI  . Diabetes Paternal Grandfather     Social History   Tobacco Use  . Smoking status: Never Smoker  . Smokeless tobacco: Never Used  Substance Use Topics  . Alcohol use: No  . Drug use: No    Review of Systems:   Pertinent items are noted in the HPI. Otherwise, ROS is  negative.  Objective:   Vitals:   05/03/18 1312  BP: 120/68  Pulse: 89  Temp: 98.6 F (37 C)  SpO2: 99%   Body mass index is 21.62 kg/m.  General Appearance:  Alert, cooperative, no distress, appears stated age  Head:  Normocephalic, without obvious abnormality, atraumatic  Eyes:  PERRL, conjunctiva/corneas clear, EOM's intact, fundi benign, both eyes       Ears:  Normal TM's and external ear canals, both ears  Nose: Nares normal, septum midline, mucosa normal, no drainage    or sinus tenderness  Throat: Lips, mucosa, and tongue normal; teeth and gums normal  Neck: Supple, symmetrical, trachea midline, no adenopathy; thyroid:  No enlargement/tenderness/nodules; no carotit bruit or JVD  Back:   Symmetric, no curvature, ROM normal, no CVA tenderness  Lungs:   Clear to auscultation bilaterally, respirations unlabored  Chest wall:  No tenderness or deformity  Heart:  Regular rate and rhythm, S1 and S2 normal, no murmur, rub   or gallop  Abdomen:   Soft, non-tender, bowel sounds active all four quadrants, no masses, no organomegaly  Extremities: Extremities normal, atraumatic, no cyanosis or edema  Skin: Skin color, texture, turgor normal, no rashes or lesions  Lymph nodes: Cervical,  supraclavicular, and axillary nodes normal  Neurologic: CNII-XII grossly intact. Normal strength, sensation and reflexes throughout    Assessment/Plan:   Kelsey Johnson was seen today for annual exam.  Diagnoses and all orders for this visit:  Routine physical examination  Irritable bowel syndrome with diarrhea -     Ambulatory referral to Gastroenterology -     dicyclomine (BENTYL) 10 MG capsule; Take 1 capsule (10 mg total) by mouth 4 (four) times daily -  before meals and at bedtime.    Patient Counseling: [x]   Nutrition: Stressed importance of moderation in sodium/caffeine intake, saturated fat and cholesterol, caloric balance, sufficient intake of fresh fruits, vegetables, and fiber.  [x]    Stressed the importance of regular exercise.   []   Substance Abuse: Discussed cessation/primary prevention of tobacco, alcohol, or other drug use; driving or other dangerous activities under the influence; availability of treatment for abuse.   [x]   Injury prevention: Discussed safety belts, safety helmets, smoke detector, smoking near bedding or upholstery.   []   Sexuality: Discussed sexually transmitted diseases, partner selection, use of condoms, avoidance of unintended pregnancy and contraceptive alternatives.   [x]   Dental health: Discussed importance of regular tooth brushing, flossing, and dental visits.  [x]   Health maintenance and immunizations reviewed. Please refer to Health maintenance section.    Helane RimaErica Nicol Herbig, DO [place] [facility]

## 2018-05-05 ENCOUNTER — Encounter: Payer: Self-pay | Admitting: Gastroenterology

## 2018-05-05 ENCOUNTER — Ambulatory Visit: Payer: 59 | Admitting: Gastroenterology

## 2018-05-05 VITALS — BP 124/72 | HR 80 | Ht 62.0 in | Wt 118.2 lb

## 2018-05-05 DIAGNOSIS — K580 Irritable bowel syndrome with diarrhea: Secondary | ICD-10-CM | POA: Diagnosis not present

## 2018-05-05 MED ORDER — HYOSCYAMINE SULFATE 0.125 MG SL SUBL: 0.1250 mg | SUBLINGUAL_TABLET | Freq: Four times a day (QID) | SUBLINGUAL | 2 refills | Status: DC | PRN

## 2018-05-05 NOTE — Progress Notes (Signed)
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 Earlene Plater of primary care earlier this week evaluate IBS. She reports having been diagnosed maybe 15 years ago, did not have a colonoscopy.  She does not recall if any medicines were used.  Sometimes it seems like dairy products bother her, but not consistently.  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  She denies a history of rectal bleeding and has none currently.  Appetite generally good, weight is stable, denies nausea or vomiting or dysphagia.  She recently started a probiotic at the suggestion of a friend, and it has decreased bowel movement frequency throughout 4-day to 1 a day.  When she has flares of symptoms, there is no rash, mouth sore or joint pain.  ROS:  Review of Systems  Constitutional: Negative for appetite change and unexpected weight change.  HENT: Negative for mouth sores and voice change.   Eyes: Negative for pain and redness.  Respiratory: Negative for cough and shortness of breath.   Cardiovascular: Negative for chest pain and palpitations.  Genitourinary: Negative for dysuria and hematuria.  Musculoskeletal: Negative for arthralgias and myalgias.  Skin: Negative for pallor and rash.  Neurological: Negative for weakness and headaches.  Hematological: Negative for adenopathy.     Past Medical History: Past Medical History:  Diagnosis Date  . Abnormal Pap smear 2011  . AMA (advanced maternal age) multigravida 35+   . ASCUS (atypical squamous cells of  undetermined significance) on Pap smear 2004   Negative for HPV  . Asthma   . CTIN (chronic tubulo-interstitial nephritis)   . Dysrhythmia SVT  . FHx: diabetes mellitus   . FHx: hypertension   . FHx: migraine headaches   . FHx: thyroid disease   . H/O candidiasis   . H/O vaginal bleeding 2005   x 8 weeks  . H/O vaginitis 2008  . H/O varicella   . Hx: UTI (urinary tract infection) 08/17/11  . IBS (irritable bowel syndrome)   . Kleine-Levin syndrome 2002  . Monilial vulvitis 2006  . Supraventricular tachycardia (HCC) 2011   Dr. Elease Hashimoto  . Urinary urgency 2004     Past Surgical History: Past Surgical History:  Procedure Laterality Date  . ADENOIDECTOMY     As a child  . TYMPANOSTOMY TUBE PLACEMENT     As a child     Family History: Family History  Problem Relation Age of Onset  . Diabetes Father   . Asthma Daughter   . Heart disease Maternal Grandfather        MI  . Alcoholism Maternal Grandfather   . Diabetes Paternal Grandfather     Social History: Social History   Socioeconomic History  . Marital status: Married    Spouse name: Not on file  . Number of children: 4  . Years of education: Not on file  . Highest education level: Not on file  Occupational History  . Not on file  Social Needs  . Financial resource strain: Not on file  . Food insecurity:    Worry: Not on file  Inability: Not on file  . Transportation needs:    Medical: Not on file    Non-medical: Not on file  Tobacco Use  . Smoking status: Never Smoker  . Smokeless tobacco: Never Used  Substance and Sexual Activity  . Alcohol use: No  . Drug use: No  . Sexual activity: Yes    Birth control/protection: Pill    Comment: MICRONOR  Lifestyle  . Physical activity:    Days per week: Not on file    Minutes per session: Not on file  . Stress: Not on file  Relationships  . Social connections:    Talks on phone: Not on file    Gets together: Not on file    Attends religious service:  Not on file    Active member of club or organization: Not on file    Attends meetings of clubs or organizations: Not on file    Relationship status: Not on file  Other Topics Concern  . Not on file  Social History Narrative  . Not on file   3rd grade teacher  Allergies: Allergies  Allergen Reactions  . Sulfa Antibiotics Other (See Comments)    Reaction unknown child    Outpatient Meds: Current Outpatient Medications  Medication Sig Dispense Refill  . LO LOESTRIN FE 1 MG-10 MCG / 10 MCG tablet Take 1 tablet by mouth daily.     . hyoscyamine (LEVSIN SL) 0.125 MG SL tablet Place 1 tablet (0.125 mg total) under the tongue every 6 (six) hours as needed. 30 tablet 2  . propranolol (INDERAL) 10 MG tablet Take 1 tablet (10 mg total) by mouth 4 (four) times daily as needed (Palpitations/fast heart rate). (Patient not taking: Reported on 05/05/2018) 60 tablet 11   No current facility-administered medications for this visit.       ___________________________________________________________________ Objective   Exam:  BP 124/72 (BP Location: Left Arm, Patient Position: Sitting, Cuff Size: Normal)   Pulse 80   Ht 5\' 2"  (1.575 m) Comment: height measured without shoes  Wt 118 lb 4 oz (53.6 kg)   BMI 21.63 kg/m    General: this is a(n) well-appearing woman  Eyes: sclera anicteric, no redness  ENT: oral mucosa moist without lesions, no cervical or supraclavicular lymphadenopathy, good dentition  CV: RRR without murmur, S1/S2, no JVD, no peripheral edema  Resp: clear to auscultation bilaterally, normal RR and effort noted  GI: soft, no tenderness, with active bowel sounds. No guarding or palpable organomegaly noted.  Skin; warm and dry, no rash or jaundice noted  Neuro: awake, alert and oriented x 3. Normal gross motor function and fluent speech  Labs:  CBC Latest Ref Rng & Units 05/12/2017 09/02/2014 07/28/2011  WBC 4.0 - 10.5 K/uL 4.8 5.5 12.6(H)  Hemoglobin 12.0 - 15.0  g/dL 24.414.4 01.014.8 10.6(L)  Hematocrit 36.0 - 46.0 % 42.9 43.6 31.4(L)  Platelets 150.0 - 400.0 K/uL 281.0 319 265   CMP Latest Ref Rng & Units 05/12/2017 09/02/2014  Glucose 70 - 99 mg/dL 85 92  BUN 6 - 23 mg/dL 13 10  Creatinine 2.720.40 - 1.20 mg/dL 5.360.87 6.440.76  Sodium 034135 - 145 mEq/L 138 139  Potassium 3.5 - 5.1 mEq/L 4.6 4.3  Chloride 96 - 112 mEq/L 105 103  CO2 19 - 32 mEq/L 30 27  Calcium 8.4 - 10.5 mg/dL 9.2 9.6  Total Protein 6.0 - 8.3 g/dL 7.0 -  Total Bilirubin 0.2 - 1.2 mg/dL 0.6 -  Alkaline Phos 39 - 117 U/L  53 -  AST 0 - 37 U/L 19 -  ALT 0 - 35 U/L 25 -     Assessment: Encounter Diagnosis  Name Primary?  . Irritable bowel syndrome with diarrhea Yes    Sounds like typical IBS but flared up last year with some work-related stress.  It is much better recently on a probiotic while on summer vacation.  She does not recall having been tested for celiac in the past, but this does not sound typical for that.  I offered to check celiac antibody, but she would like to hold off.Solita felt she needed a back-up plan in case this problem flares again.  I gave her some written dietary advice and a trial of hyoscyamine to take as needed.  I gave her my card to contact us and see me as needed.  Thank you for the courtesy of this consult.  Please call me with any questions or concerns.  Charlie Pitter III  CC: Helane Rima, DO

## 2018-05-05 NOTE — Patient Instructions (Addendum)
If you are age 47 or older, your body mass index should be between 23-30. Your Body mass index is 21.63 kg/m. If this is out of the aforementioned range listed, please consider follow up with your Primary Care Provider.  If you are age 47 or younger, your body mass index should be between 19-25. Your Body mass index is 21.63 kg/m. If this is out of the aformentioned range listed, please consider follow up with your Primary Care Provider.     Food Guidelines for a sensitive stomach  Many people have difficulty digesting certain foods, causing a variety of distressing and embarrassing symptoms such as abdominal pain, bloating and gas.  These foods may need to be avoided or consumed in small amounts.  Here are some tips that might be helpful for you.  1.   Lactose intolerance is the difficulty or complete inability to digest lactose, the natural sugar in milk and anything made from milk.  This condition is harmless, common, and can begin any time during life.  Some people can digest a modest amount of lactose while others cannot tolerate any.  Also, not all dairy products contain equal amounts of lactose.  For example, hard cheeses such as parmesan have less lactose than soft cheeses such as cheddar.  Yogurt has less lactose than milk or cheese.  Many packaged foods (even many brands of bread) have milk, so read ingredient lists carefully.  It is difficult to test for lactose intolerance, so just try avoiding lactose as much as possible for a week and see what happens with your symptoms.  If you seem to be lactose intolerant, the best plan is to avoid it (but make sure you get calcium from another source).  The next best thing is to use lactase enzyme supplements, available over the counter everywhere.  Just know that many lactose intolerant people need to take several tablets with each serving of dairy to avoid symptoms.  Lastly, a lot of restaurant food is made with milk or butter.  Many are things you  might not suspect, such as mashed potatoes, rice and pasta (cooked with butter) and "grilled" items.  If you are lactose intolerant, it never hurts to ask your server what has milk or butter.  2.   Fiber is an important part of your diet, but not all fiber is well-tolerated.  Insoluble fiber such as bran is often consumed by normal gut bacteria and converted into gas.  Soluble fiber such as oats, squash, carrots and green beans are typically tolerated better.  3.   Some types of carbohydrates can be poorly digested.  Examples include: fructose (apples, cherries, pears, raisins and other dried fruits), fructans (onions, zucchini, large amounts of wheat), sorbitol/mannitol/xylitol and sucralose/Splenda (common artificial sweeteners), and raffinose (lentils, broccoli, cabbage, asparagus, brussel sprouts, many types of beans).  Do a Programmer, multimediaweb search for National CityFODMAP diet and you will find helpful information. Beano, a dietary supplement, will often help with raffinose-containing foods.  As with lactase tablets, you may need several per serving.  4.   Whenever possible, avoid processed food&meats and chemical additives.  High fructose corn syrup, a common sweetener, may be difficult to digest.  Eggs and soy (comes from the soybean, and added to many foods now) are other common bloating/gassy foods.  - Dr. Sherlynn CarbonHenry Danis [facility]

## 2018-05-19 ENCOUNTER — Ambulatory Visit: Payer: Self-pay

## 2018-05-19 ENCOUNTER — Encounter: Payer: Self-pay | Admitting: Physician Assistant

## 2018-05-19 ENCOUNTER — Ambulatory Visit: Payer: 59 | Admitting: Physician Assistant

## 2018-05-19 VITALS — BP 124/84 | HR 86 | Temp 98.5°F | Ht 62.0 in | Wt 117.8 lb

## 2018-05-19 DIAGNOSIS — K1379 Other lesions of oral mucosa: Secondary | ICD-10-CM | POA: Diagnosis not present

## 2018-05-19 MED ORDER — VALACYCLOVIR HCL 1 G PO TABS: ORAL_TABLET | ORAL | 2 refills | Status: DC

## 2018-05-19 NOTE — Telephone Encounter (Signed)
Noted  

## 2018-05-19 NOTE — Telephone Encounter (Signed)
Patient called in with c/o "lip swelling." She says "I noticed it swelling a couple of days ago, but it is swollen on the inside not the outside. It is not noticeable, but I can feel it. I also have a couple of bumps under my tongue and some to the inside of my upper and lower lip. No itching, no pain, but it is noticeable." I asked about other symptoms, difficulty swallowing, difficulty breathing, she denies. She says it is uncomfortable to brush her teeth. According to protocol, see PCP within 3 days, no availability today with PCP, appointment scheduled today at 1600 with Jarold MottoSamantha Worley, PA, care advice given, patient verbalized understanding.   Reason for Disposition . [1] Mild lip swelling from food reaction AND [2] diagnosis never confirmed by a HCP  Answer Assessment - Initial Assessment Questions 1. ONSET: "When did the swelling start?" (e.g., minutes, hours, days)     A couple of days ago 2. SEVERITY: "How swollen is it?"     More swollen felt on the inside 3. ITCHING: "Is there any itching?" If so, ask: "How much?"   (Scale 1-10; mild, moderate or severe)     No 4. PAIN: "Is the swelling painful to touch?" If so, ask: "How painful is it?"   (Scale 1-10; mild, moderate or severe)     No 5. CAUSE: "What do you think is causing the lip swelling?"     I don't know 6. RECURRENT SYMPTOM: "Have you had lip swelling before?" If so, ask: "When was the last time?" "What happened that time?"     No 7. OTHER SYMPTOMS: "Do you have any other symptoms?" (e.g., toothache)     Tingling to lips, small bumps under tongue and a few along the inside of both upper and lower lips 8. PREGNANCY: "Is there any chance you are pregnant?" "When was your last menstrual period?"     No  Protocols used: [facility]

## 2018-05-19 NOTE — Progress Notes (Signed)
Kelsey Johnson is a 47 y.o. female here for a new problem.   History of Present Illness:   Chief Complaint  Patient presents with  . Facial Swelling    HPI   She has had some swelling to her gums that she noticed on Monday. She has also noticed a few bumps on her inner upper and lower lips. Denies pain but does have sensitivity. She has noticed some bumps under her tongue as well. She denies any unusual food allergies or history of unusual reactions. Denies SOB, airway swelling, difficulty breathing. Denies recent URI symptoms. 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  Past Medical History:  Diagnosis Date  . Abnormal Pap smear 2011  . AMA (advanced maternal age) multigravida 35+   . ASCUS (atypical squamous cells of undetermined significance) on Pap smear 2004   Negative for HPV  . Asthma   . CTIN (chronic tubulo-interstitial nephritis)   . Dysrhythmia SVT  . FHx: diabetes mellitus   . FHx: hypertension   . FHx: migraine headaches   . FHx: thyroid disease   . H/O candidiasis   . H/O vaginal bleeding 2005   x 8 weeks  . H/O vaginitis 2008  . H/O varicella   . Hx: UTI (urinary tract infection) 08/17/11  . IBS (irritable bowel syndrome)   . Kleine-Levin syndrome 2002  . Monilial vulvitis 2006  . Supraventricular tachycardia (HCC) 2011   Dr. Elease HashimotoNahser  . Urinary urgency 2004     Social History   Socioeconomic History  . Marital status: Married    Spouse name: Not on file  . Number of children: 4  . Years of education: Not on file  . Highest education level: Not on file  Occupational History  . Not on file  Social Needs  . Financial resource strain: Not on file  . Food insecurity:    Worry: Not on file    Inability: Not on file  . Transportation needs:    Medical: Not on file    Non-medical: Not on file  Tobacco Use  . Smoking status: Never Smoker  . Smokeless tobacco: Never Used  Substance and Sexual Activity  . Alcohol  use: No  . Drug use: No  . Sexual activity: Yes    Birth control/protection: Pill    Comment: MICRONOR  Lifestyle  . Physical activity:    Days per week: Not on file    Minutes per session: Not on file  . Stress: Not on file  Relationships  . Social connections:    Talks on phone: Not on file    Gets together: Not on file    Attends religious service: Not on file    Active member of club or organization: Not on file    Attends meetings of clubs or organizations: Not on file    Relationship status: Not on file  . Intimate partner violence:    Fear of current or ex partner: Not on file    Emotionally abused: Not on file    Physically abused: Not on file    Forced sexual activity: Not on file  Other Topics Concern  . Not on file  Social History Narrative  . Not on file    Past Surgical History:  Procedure Laterality Date  . ADENOIDECTOMY     As a child  . TYMPANOSTOMY TUBE PLACEMENT     As a child    Family History  Problem Relation  Age of Onset  . Diabetes Father   . Asthma Daughter   . Heart disease Maternal Grandfather        MI  . Alcoholism Maternal Grandfather   . Diabetes Paternal Grandfather     Allergies  Allergen Reactions  . Sulfa Antibiotics Other (See Comments)    Reaction unknown child    Current Medications:   Current Outpatient Medications:  .  hyoscyamine (LEVSIN SL) 0.125 MG SL tablet, Place 1 tablet (0.125 mg total) under the tongue every 6 (six) hours as needed., Disp: 30 tablet, Rfl: 2 .  LO LOESTRIN FE 1 MG-10 MCG / 10 MCG tablet, Take 1 tablet by mouth daily. , Disp: , Rfl:  .  propranolol (INDERAL) 10 MG tablet, Take 1 tablet (10 mg total) by mouth 4 (four) times daily as needed (Palpitations/fast heart rate). (Patient not taking: Reported on 05/05/2018), Disp: 60 tablet, Rfl: 11 .  valACYclovir (VALTREX) 1000 MG tablet, Take two tablets ( total 2000 mg) by mouth q12h x 1 day; Start: ASAP after symptom onset, Disp: 6 tablet, Rfl: 2    Review of Systems:   ROS  Negative unless otherwise specified per HPI.   Vitals:   Vitals:   05/19/18 1605  BP: 124/84  Pulse: 86  Temp: 98.5 F (36.9 C)  TempSrc: Oral  SpO2: 98%  Weight: 117 lb 12.8 oz (53.4 kg)  Height: 5\' 2"  (1.575 m)     Body mass index is 21.55 kg/m.  Physical Exam:   Physical Exam  Constitutional: She appears well-developed. She is cooperative.  Non-toxic appearance. She does not have a sickly appearance. She does not appear ill. No distress.  HENT:  Head: Normocephalic and atraumatic.  Mouth/Throat: Uvula is midline, oropharynx is clear and moist and mucous membranes are normal.  Small abrasion to lower inner lip near R lower incisor, small pustule under tongue on R side of mouth. Gums without obvious swelling or bleeding.  Cardiovascular: Normal rate, regular rhythm, S1 normal, S2 normal, normal heart sounds and normal pulses.  No LE edema  Pulmonary/Chest: Effort normal and breath sounds normal.  Neurological: She is alert. GCS eye subscore is 4. GCS verbal subscore is 5. GCS motor subscore is 6.  Skin: Skin is warm, dry and intact.  Psychiatric: She has a normal mood and affect. Her speech is normal and behavior is normal.  Nursing note and vitals reviewed.   Assessment and Plan:    Kelsey Johnson was seen today for facial swelling.  Diagnoses and all orders for this visit:  Mouth pain  Other orders -     valACYclovir (VALTREX) 1000 MG tablet; Take two tablets ( total 2000 mg) by mouth q12h x 1 day; Start: ASAP after symptom onset   No red flags on exam. 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 Will provide valtrex for patient to take if lesion develops. If symptoms persists, follow-up with dentist. Patient agreeable to plan.  . Reviewed expectations re: course of current medical issues. . Discussed self-management of symptoms. . Outlined signs and symptoms indicating need for more acute  intervention. . Patient verbalized understanding and all questions were answered. . See orders for this visit as documented in the electronic medical record. . Patient received an After-Visit Summary.   Jarold MottoSamantha Atlee Villers, PA-C

## 2018-05-19 NOTE — Patient Instructions (Signed)
It was great to see you!  If symptoms persist, take 1 valtrex tablet and then repeat another 12 hours later. This can help with any cold sores lesions that you may be developing. Otherwise, follow-up with your dentist if you have concerns.   Take care,  Jarold MottoSamantha Memory Heinrichs PA-C

## 2018-05-19 NOTE — Telephone Encounter (Signed)
See note

## 2018-07-13 ENCOUNTER — Ambulatory Visit (INDEPENDENT_AMBULATORY_CARE_PROVIDER_SITE_OTHER): Payer: 59

## 2018-07-13 DIAGNOSIS — Z23 Encounter for immunization: Secondary | ICD-10-CM | POA: Diagnosis not present

## 2018-07-13 NOTE — Progress Notes (Signed)
Patient in today for Flu vaccine. VIS given. Administered in right arm. Tolerated well 

## 2018-07-13 NOTE — Progress Notes (Signed)
I have reviewed the patient's encounter and agree with the documentation.  Kelsey Cerny M. [name], MD 07/13/2018 5:23 PM   

## 2018-07-13 NOTE — Patient Instructions (Signed)
There are no preventive care reminders to display for this patient.  Depression screen [facility] 2/9 05/03/2018  Decreased Interest 0  Down, Depressed, Hopeless 0  PHQ - 2 Score 0  Altered sleeping 0  Tired, decreased energy 0  Change in appetite 0  Feeling bad or failure about yourself  0  Trouble concentrating 0  Moving slowly or fidgety/restless 0  Suicidal thoughts 0  PHQ-9 Score 0  Difficult doing work/chores Not difficult at all

## 2018-07-23 DIAGNOSIS — J00 Acute nasopharyngitis [common cold]: Secondary | ICD-10-CM | POA: Diagnosis not present

## 2018-08-30 DIAGNOSIS — N898 Other specified noninflammatory disorders of vagina: Secondary | ICD-10-CM | POA: Diagnosis not present

## 2018-11-20 ENCOUNTER — Ambulatory Visit: Payer: 59 | Admitting: Gastroenterology

## 2018-11-20 ENCOUNTER — Encounter: Payer: Self-pay | Admitting: Gastroenterology

## 2018-11-20 ENCOUNTER — Other Ambulatory Visit (INDEPENDENT_AMBULATORY_CARE_PROVIDER_SITE_OTHER): Payer: 59

## 2018-11-20 VITALS — BP 122/84 | HR 88 | Ht 62.0 in | Wt 123.1 lb

## 2018-11-20 DIAGNOSIS — K580 Irritable bowel syndrome with diarrhea: Secondary | ICD-10-CM

## 2018-11-20 DIAGNOSIS — R140 Abdominal distension (gaseous): Secondary | ICD-10-CM

## 2018-11-20 LAB — IGA: IgA: 177 mg/dL (ref 68–378)

## 2018-11-20 MED ORDER — HYOSCYAMINE SULFATE ER 0.375 MG PO TB12: 0.3750 mg | ORAL_TABLET | Freq: Every day | ORAL | 0 refills | Status: DC

## 2018-11-20 NOTE — Patient Instructions (Signed)
If you are age 48 or older, your body mass index should be between 23-30. Your Body mass index is 22.52 kg/m. If this is out of the aforementioned range listed, please consider follow up with your Primary Care Provider.  If you are age 28 or younger, your body mass index should be between 19-25. Your Body mass index is 22.52 kg/m. If this is out of the aformentioned range listed, please consider follow up with your Primary Care Provider.   We have given you samples of the following medication to take:  IBGard, take 2 in the evening  Your provider has requested that you go to the basement level for lab work before leaving today. Press "B" on the elevator. The lab is located at the first door on the left as you exit the elevator.  It was a pleasure to see you today!  Dr. Myrtie Neither

## 2018-11-20 NOTE — Progress Notes (Signed)
     [facility] Progress Note  Chief Complaint: IBS  Subjective  History:  Kelsey Johnson follows up for the first time since her August 2019 visit.  She is alternated with the Imodium and hyoscyamine and has had some symptom relief with those.  Symptoms are still usually worse in the morning, but now she has intermittent diarrhea that wakes her from sleep.  She has not had incontinence or bleeding.  She has abdominal bloating and rumbling and gurgling but no real pain. Often has need for BM fairly urgently after meals.  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  ROS: Cardiovascular:  no chest pain Respiratory: no dyspnea Weight has been "up and down" but stable overall.  The patient's Past Medical, Family and Social History were reviewed and are on file in the EMR.  Objective:  Med list reviewed  Current Outpatient Medications:  .  hyoscyamine (LEVSIN SL) 0.125 MG SL tablet, Place 1 tablet (0.125 mg total) under the tongue every 6 (six) hours as needed., Disp: 30 tablet, Rfl: 2 .  LO LOESTRIN FE 1 MG-10 MCG / 10 MCG tablet, Take 1 tablet by mouth daily. , Disp: , Rfl:  .  propranolol (INDERAL) 10 MG tablet, Take 1 tablet (10 mg total) by mouth 4 (four) times daily as needed (Palpitations/fast heart rate)., Disp: 60 tablet, Rfl: 11 .  hyoscyamine (LEVBID) 0.375 MG 12 hr tablet, Take 1 tablet (0.375 mg total) by mouth daily., Disp: 30 tablet, Rfl: 0   Vital signs in last 24 hrs: Vitals:   11/20/18 1559  BP: 122/84  Pulse: 88    Physical Exam  She is well-appearing  HEENT: sclera anicteric, oral mucosa moist without lesions  Neck: supple, no thyromegaly, JVD or lymphadenopathy  Cardiac: RRR without murmurs, S1S2 heard, no peripheral edema  Pulm: clear to auscultation bilaterally, normal RR and effort noted  Abdomen: soft, no tenderness, with active bowel sounds. No guarding or palpable hepatosplenomegaly.  Skin; warm and dry, no jaundice  or rash  @ASSESSMENTPLANBEGIN @ Assessment: Encounter Diagnoses  Name Primary?  . Irritable bowel syndrome with diarrhea Yes  . Abdominal bloating     It is still most likely IBS D, but we should rule out celiac sprue with antibodies.  She was agreeable to that today.  She is still reluctant to pursue colonoscopy if it can be avoided.  I advised changing to higher dose hyoscyamine 1 to to take every morning for about the next 10 to 14 days, gave some samples of IBgard take 2 capsules every night, and blood work as above.  If she is not much improved on that, colonoscopy should be done to rule out IBD or microscopic colitis before considering Viberzi.  Recommend primary care evaluation for stress reduction.  Total time 20 minutes, over half spent face-to-face with patient in counseling and coordination of care.   Charlie Pitter III

## 2018-11-21 LAB — TISSUE TRANSGLUTAMINASE, IGA: (tTG) Ab, IgA: 1 U/mL

## 2018-12-20 ENCOUNTER — Other Ambulatory Visit: Payer: Self-pay

## 2018-12-20 MED ORDER — HYOSCYAMINE SULFATE ER 0.375 MG PO TB12: 0.3750 mg | ORAL_TABLET | Freq: Every day | ORAL | 0 refills | Status: DC

## 2019-05-01 LAB — HM MAMMOGRAPHY

## 2019-05-03 ENCOUNTER — Encounter: Payer: Self-pay | Admitting: Family Medicine

## 2019-07-24 ENCOUNTER — Other Ambulatory Visit: Payer: Self-pay

## 2019-07-24 ENCOUNTER — Encounter: Payer: Self-pay | Admitting: Family Medicine

## 2019-07-24 ENCOUNTER — Ambulatory Visit (INDEPENDENT_AMBULATORY_CARE_PROVIDER_SITE_OTHER): Payer: 59

## 2019-07-24 DIAGNOSIS — Z23 Encounter for immunization: Secondary | ICD-10-CM

## 2019-08-28 ENCOUNTER — Other Ambulatory Visit: Payer: Self-pay

## 2019-08-28 MED ORDER — HYOSCYAMINE SULFATE ER 0.375 MG PO TB12: 0.3750 mg | ORAL_TABLET | Freq: Every day | ORAL | 0 refills | Status: DC

## 2019-09-24 ENCOUNTER — Encounter (INDEPENDENT_AMBULATORY_CARE_PROVIDER_SITE_OTHER): Payer: Self-pay | Admitting: Family Medicine

## 2019-09-25 NOTE — Telephone Encounter (Signed)
Please review

## 2019-10-30 ENCOUNTER — Encounter: Payer: Self-pay | Admitting: Gastroenterology

## 2019-10-30 ENCOUNTER — Ambulatory Visit: Payer: 59 | Admitting: Gastroenterology

## 2019-10-30 VITALS — BP 124/80 | HR 56 | Temp 98.3°F | Ht 62.0 in | Wt 122.6 lb

## 2019-10-30 DIAGNOSIS — R198 Other specified symptoms and signs involving the digestive system and abdomen: Secondary | ICD-10-CM

## 2019-10-30 DIAGNOSIS — K580 Irritable bowel syndrome with diarrhea: Secondary | ICD-10-CM | POA: Diagnosis not present

## 2019-10-30 DIAGNOSIS — R140 Abdominal distension (gaseous): Secondary | ICD-10-CM

## 2019-10-30 MED ORDER — BUSPIRONE HCL 15 MG PO TABS: 15.0000 mg | ORAL_TABLET | Freq: Every day | ORAL | 0 refills | Status: DC

## 2019-10-30 NOTE — Progress Notes (Signed)
[facility] Progress Note  Chief Complaint: IBS  Subjective  History: Initially seen August 2019 for chronic IBS-D symptoms, though with no prior work-up.  When seen last February 2020 was agreeable to celiac antibody (which was negative), but wanted to hold off on colonoscopy.  Hyoscyamine prescribed.   Kelsey Johnson is still struggling with this problem.  She was taking hyoscyamine or Imodium alternating as needed.  However, in the last couple of months she has had more frequent episodes of urgency, often soon after leaving the house on the way to store or work.  So now she takes 1 hyoscyamine and 1 Imodium every morning.  The symptoms have become distressing with the need to find a bathroom everywhere she goes.  It will not necessarily be followed by loose stool, sometimes just gas or mucus.  She is frustrated that it may be due to underlying stress, but if so troubled that it also happens on the weekends.  It kept her from going shopping this weekend and is generally affecting her quality of life. She denies rectal bleeding.  Her appetite is good and weight stable.  She denies nausea vomiting dysphagia or anorexia.  ROS: Cardiovascular:  no chest pain Respiratory: no dyspnea Remainder of systems negative except as above The patient's Past Medical, Family and Social History were reviewed and are on file in the EMR.  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  hyoscyamine (LEVBID) 0.375 MG 12 hr tablet, Take 1 tablet (0.375 mg total) by mouth daily., Disp: 30 tablet, Rfl: 0 .  LO LOESTRIN FE 1 MG-10 MCG / 10 MCG tablet, Take 1 tablet by mouth daily. , Disp: , Rfl:  .  propranolol (INDERAL) 10 MG tablet, Take 1 tablet (10 mg total) by mouth 4 (four) times daily as needed (Palpitations/fast heart rate)., Disp: 60 tablet, Rfl: 11 .  busPIRone (BUSPAR) 15 MG tablet, Take 1 tablet (15 mg total) by mouth at  bedtime. Start with one-half tablet at bedtime for 1 week, then increase to once whole tablet at bedtime, Disp: 30 tablet, Rfl: 0   Vital signs in last 24 hrs: Vitals:   10/30/19 1500  BP: 124/80  Pulse: (!) 56  Temp: 98.3 F (36.8 C)    Physical Exam  Well-appearing  HEENT: sclera anicteric, oral mucosa moist without lesions  Neck: supple, no thyromegaly, JVD or lymphadenopathy  Cardiac: RRR without murmurs, S1S2 heard, no peripheral edema  Pulm: clear to auscultation bilaterally, normal RR and effort noted  Abdomen: soft, no tenderness, with active bowel sounds. No guarding or palpable hepatosplenomegaly.  Skin; warm and dry, no jaundice or rash  @ASSESSMENTPLANBEGIN @ Assessment: Encounter Diagnoses  Name Primary?  . Irritable bowel syndrome with diarrhea Yes  . Tenesmus   . Abdominal bloating    Overall, still most consistent with IBS D, though symptoms more bothersome lately with legs of urgency.  There still seems likely to be an underlying stress factor, which does not necessarily stop outside of work. The chronic nature and treatment of IBS were discussed. The cause is not completely understood, but is likely to be a combination of genetics, diet, stress, visceral hypersensitivity and the gut microbiome.  The available treatments aim to control symptoms even if unable to "cure" the condition.  While not physically harmful, IBS can have a significant impact on quality of life.  I would like her to have a colonoscopy to  be sure of diagnosis, she feels like it probably will not be feasible until the summer when she has a long break from school.  I have agreed to that, but would not like it to go longer than that, especially if we need to escalate therapy directed an underlying anxiety.   Plan: Written diet related IBS educational materials given.  Trial of buspirone 15 mg tablet, take 1/2 tablet at bedtime for a week.  If tolerated, increase to 1 whole tablet at  bedtime.  Cautioned against possible sedating side effects.  She will reach out to [place] by phone call or portal in about 2 weeks or sooner if necessary with an update on symptoms.  30 minutes were spent on this encounter (including chart review, history/exam, counseling/coordination of care, and documentation)  Charlie Pitter III

## 2019-10-30 NOTE — Patient Instructions (Signed)
_______________________________________________________ Food Guidelines for those with chronic digestive trouble:  Many people have difficulty digesting certain foods, causing a variety of distressing and embarrassing symptoms such as abdominal pain, bloating and gas.  These foods may need to be avoided or consumed in small amounts.  Here are some tips that might be helpful for you.  1.   Lactose intolerance is the difficulty or complete inability to digest lactose, the natural sugar in milk and anything made from milk.  This condition is harmless, common, and can begin any time during life.  Some people can digest a modest amount of lactose while others cannot tolerate any.  Also, not all dairy products contain equal amounts of lactose.  For example, hard cheeses such as parmesan have less lactose than soft cheeses such as cheddar.  Yogurt has less lactose than milk or cheese.  Many packaged foods (even many brands of bread) have milk, so read ingredient lists carefully.  It is difficult to test for lactose intolerance, so just try avoiding lactose as much as possible for a week and see what happens with your symptoms.  If you seem to be lactose intolerant, the best plan is to avoid it (but make sure you get calcium from another source).  The next best thing is to use lactase enzyme supplements, available over the counter everywhere.  Just know that many lactose intolerant people need to take several tablets with each serving of dairy to avoid symptoms.  Lastly, a lot of restaurant food is made with milk or butter.  Many are things you might not suspect, such as mashed potatoes, rice and pasta (cooked with butter) and "grilled" items.  If you are lactose intolerant, it never hurts to ask your server what has milk or butter.  2.   Fiber is an important part of your diet, but not all fiber is well-tolerated.  Insoluble fiber such as bran is often consumed by normal gut bacteria and converted into gas.  Soluble  fiber such as oats, squash, carrots and green beans are typically tolerated better.  3.   Some types of carbohydrates can be poorly digested.  Examples include: fructose (apples, cherries, pears, raisins and other dried fruits), fructans (onions, zucchini, large amounts of wheat), sorbitol/mannitol/xylitol and sucralose/Splenda (common artificial sweeteners), and raffinose (lentils, broccoli, cabbage, asparagus, brussel sprouts, many types of beans).  Do a web search for FODMAP diet and you will find helpful information. Beano, a dietary supplement, will often help with raffinose-containing foods.  As with lactase tablets, you may need several per serving.  4.   Whenever possible, avoid processed food&meats and chemical additives.  High fructose corn syrup, a common sweetener, may be difficult to digest.  Eggs and soy (comes from the soybean, and added to many foods now) are other common bloating/gassy foods.  5.  Regarding gluten:  gluten is a protein mainly found in wheat, but also rye and barley.  There is a condition called celiac sprue, which is an inflammatory reaction in the small intestine causing a variety of digestive symptoms.  Blood testing is highly reliable to look for this condition, and sometimes upper endoscopy with small bowel biopsies may be necessary to make the diagnosis.  Many patients who test negative for celiac sprue report improvement in their digestive symptoms when they switch to a gluten-free diet.  However, in these "non-celiac gluten sensitive" patients, the true role of gluten in their symptoms is unclear.  Reducing carbohydrates in general may decrease the gas and   bloating caused when gut bacteria consume carbs. Also, some of these patients may actually be intolerant of the baker's yeast in bread products rather than the gluten.  Flatbread and other reduced yeast breads might therefore be tolerated.  There is no specific testing available for most food intolerances, which are  discovered mainly by dietary elimination.  Please do not embark on a gluten free diet unless directed by your doctor, as it is highly restrictive, and may lead to nutritional deficiencies if not carefully monitored.  Lastly, beware of internet claims offering "personalized" tests for food intolerances.  Such testing has no reliable scientific evidence to support its reliability and correlation to symptoms.    6.  The best advice is old advice, especially for those with chronic digestive trouble - try to eat "clean".  Balanced diet, avoid processed food, plenty of fruits and vegetables, cut down the sugar, minimal alcohol, avoid tobacco. Make time to care for yourself, get enough sleep, exercise when you can, reduce stress.  Your guts will thank you for it.   - Dr. Ahmarion Saraceno Dan[facility]  ____________________________________________________________     

## 2019-11-05 ENCOUNTER — Other Ambulatory Visit: Payer: Self-pay

## 2019-11-05 MED ORDER — HYOSCYAMINE SULFATE ER 0.375 MG PO TB12: 0.3750 mg | ORAL_TABLET | Freq: Every day | ORAL | 0 refills | Status: DC

## 2019-11-28 ENCOUNTER — Telehealth: Payer: Self-pay

## 2019-11-28 NOTE — Telephone Encounter (Signed)
Incoming fax request from [facility] for hyoscyamine 0.375 mg 1 daily. It looks like you were talking to her about increasing dose. Please advise. Thank you.

## 2019-11-29 MED ORDER — HYOSCYAMINE SULFATE ER 0.375 MG PO TB12: 0.3750 mg | ORAL_TABLET | Freq: Every day | ORAL | 2 refills | Status: DC

## 2019-11-29 NOTE — Telephone Encounter (Signed)
I refilled it, thanks

## 2019-12-03 ENCOUNTER — Telehealth: Payer: Self-pay

## 2019-12-03 NOTE — Telephone Encounter (Signed)
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 TOC appt 12/12/19 at 3:3opm

## 2019-12-03 NOTE — Telephone Encounter (Signed)
Ok to do [employer] or push out to when she can come in office?

## 2019-12-04 NOTE — Telephone Encounter (Signed)
Called and LVM to relay message from Dr. Mardelle Matte

## 2019-12-04 NOTE — Telephone Encounter (Signed)
Pt called stating she will keep original appt and be seen in person

## 2019-12-04 NOTE — Telephone Encounter (Signed)
Pt had last cpe in 2019.  Labs in chart from 2018. Overdue for cpe.  Needs in office visit.  She may schedule when she'd like.  ? Why teacher needs virtual only?  Can choose another provider if needed; I recommend office visit for cpe.  Thanks.

## 2019-12-04 NOTE — Telephone Encounter (Signed)
See below

## 2019-12-12 ENCOUNTER — Ambulatory Visit: Payer: 59 | Admitting: Family Medicine

## 2019-12-12 ENCOUNTER — Other Ambulatory Visit: Payer: Self-pay

## 2019-12-12 ENCOUNTER — Encounter: Payer: Self-pay | Admitting: Family Medicine

## 2019-12-12 VITALS — BP 126/72 | HR 80 | Temp 98.3°F | Ht 62.0 in | Wt 119.8 lb

## 2019-12-12 DIAGNOSIS — M1990 Unspecified osteoarthritis, unspecified site: Secondary | ICD-10-CM

## 2019-12-12 DIAGNOSIS — R002 Palpitations: Secondary | ICD-10-CM | POA: Diagnosis not present

## 2019-12-12 DIAGNOSIS — K580 Irritable bowel syndrome with diarrhea: Secondary | ICD-10-CM | POA: Diagnosis not present

## 2019-12-12 MED ORDER — SERTRALINE HCL 25 MG PO TABS: ORAL_TABLET | ORAL | 1 refills | Status: DC

## 2019-12-12 NOTE — Progress Notes (Signed)
Subjective  CC:  Chief Complaint  Patient presents with  . Transitions Of Care    TOC from Dr. Juleen China  . irritable bowel syndome    GI prescribed buspirone and thinks symptoms are anxiety related. Medication not working    HPI: Kelsey Johnson is a 49 y.o. female who presents to [facility] today to establish care with me as a new patient.  Reviewed multiple old records and GI results/reports She has the following concerns or needs:  IBS: "for years" but worsening over last 18 months for unclear reasons. Has had GI eval. Neg celiac eval. No blood or mucous in stool. No discomfort but now with multiple loose stools with urgency are rare fecal incontinence. Defers colonoscopy but will have one. Now on hyoscyamine and ibGuard with some improvement over the last few weeks. No weight loss. Has known food triggers but avoids them.   Describes herself as on "autopilot" most of the time. 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 Sleeps well because she is exhausted. No h/o mood problems but is described as type A or uptight. Denies panic attacks, anxiety chronically or depressive sxs. Will have diarrhea when she "gets upset".   Reviewed pmh: palpitations evaluated by cards w/ remote h/o svt; no longer active. Had used prn bb.   Seronegative inflammatory arthritis. Reportedly stable.   Overdue for cpe.   Assessment  1. Irritable bowel syndrome with diarrhea   2. Inflammatory arthritis   3. Palpitations      Plan   IBS, diarrhea; counseling done. Rec checking labwork. Continuation of ibguard and antispamodic and add SSRI: zoloft. Could be helpful to regulate hypersensitivity of gut and can monitor to see if positively affects her motions/emotional state. Close f/u. rec colonoscopy if not improving   Follow up:  No follow-ups on file. Orders Placed This Encounter  Procedures  . CBC with Differential/Platelet  . Comprehensive  metabolic panel  . TSH  . Sedimentation rate   Meds ordered this encounter  Medications  . sertraline (ZOLOFT) 25 MG tablet    Sig: Take 1 tablet (25 mg total) by mouth daily for 14 days, THEN 2 tablets (50 mg total) daily for 14 days.    Dispense:  42 tablet    Refill:  1     Depression screen [facility] 2/9 12/12/2019 05/03/2018  Decreased Interest 0 0  Down, Depressed, Hopeless 0 0  PHQ - 2 Score 0 0  Altered sleeping - 0  Tired, decreased energy - 0  Change in appetite - 0  Feeling bad or failure about yourself  - 0  Trouble concentrating - 0  Moving slowly or fidgety/restless - 0  Suicidal thoughts - 0  PHQ-9 Score - 0  Difficult doing work/chores - Not difficult at all    We updated and reviewed the patient's past history in detail and it is documented below.  Patient Active Problem List   Diagnosis Date Noted  . Palpitations 06/15/2017  . Inflammatory arthritis 05/19/2017    Followed at [facility]. 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 Controlled on Celebrex. 05/19/2017    . 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 Asthma 07/27/2011  . Irritable bowel syndrome 07/27/2011  . SVT (supraventricular tachycardia) ([place]) 07/27/2011   Health Maintenance  Topic Date Due  . PAP SMEAR-Modifier  04/12/2020  . TETANUS/TDAP  07/27/2021  . INFLUENZA VACCINE  Completed  . HIV Screening  Completed  Immunization History  Administered Date(s) Administered  . Influenza,inj,Quad PF,6+ Mos 07/13/2018, 07/24/2019  . PPD Test 04/26/2017  . Tdap 07/28/2011   Current Meds  Medication Sig  . hyoscyamine (LEVBID) 0.375 MG 12 hr tablet Take 1 tablet (0.375 mg total) by mouth daily.  . LO LOESTRIN FE 1 MG-10 MCG / 10 MCG tablet Take 1 tablet by mouth daily.   . [DISCONTINUED] busPIRone (BUSPAR) 15 MG tablet Take 1 tablet (15 mg total) by mouth at bedtime. Start with one-half tablet at bedtime for 1 week, then increase to once whole tablet at bedtime    Allergies: Patient  is allergic to sulfa antibiotics. Past Medical History Patient  has a past medical history of Arthritis, Asthma, CTIN (chronic tubulo-interstitial nephritis), IBS (irritable bowel syndrome), IBS (irritable bowel syndrome), Kleine-Levin syndrome (2002), and Supraventricular tachycardia (HCC) (2011). Past Surgical History Patient  has a past surgical history that includes Tympanostomy tube placement and Adenoidectomy. Family History: Patient family history includes Alcoholism in her maternal grandfather; Asthma in her daughter; Diabetes in her father and paternal grandfather; Healthy in her daughter, daughter, and son; Heart disease in her maternal grandfather. Social History:  Patient  reports that she has never smoked. She has never used smokeless tobacco. She reports that she does not drink alcohol or use drugs.  Review of Systems: Constitutional: negative for fever or malaise Ophthalmic: negative for photophobia, double vision or loss of vision Cardiovascular: negative for chest pain, dyspnea on exertion, or new LE swelling Respiratory: negative for SOB or persistent cough Gastrointestinal: negative for abdominal pain,  Genitourinary: negative for dysuria or gross hematuria Musculoskeletal: negative for new gait disturbance or muscular weakness Integumentary: negative for new or persistent rashes Neurological: negative for TIA or stroke symptoms Psychiatric: negative for SI or delusions Allergic/Immunologic: negative for hives  Patient Care Team    Relationship Specialty Notifications Start End  Willow Ora, MD PCP - General Family Medicine  12/12/19   Nigel Bridgeman, CNM Midwife Obstetrics and Gynecology  05/14/19   Sherrilyn Rist, MD Consulting Physician Gastroenterology  12/12/19   Nahser, Deloris Ping, MD Consulting Physician Cardiology  12/12/19     Objective  Vitals: BP 126/72 (BP Location: Left Arm, Patient Position: Sitting, Cuff Size: Normal)   Pulse 80   Temp 98.3 F  (36.8 C) (Temporal)   Ht 5\' 2"  (1.575 m)   Wt 119 lb 12.8 oz (54.3 kg)   SpO2 99%   BMI 21.91 kg/m  General:  Well developed, well nourished, no acute distress , appears well Psych:  Alert and oriented,normal mood and affect, good insight HEENT:  Normocephalic, atraumatic, non-icteric sclera, PERRL, supple neck without adenopathy, mass or thyromegaly Cardiovascular:  RRR without gallop, rub or murmur Respiratory:  Good breath sounds bilaterally, CTAB with normal respiratory effort Gastrointestinal: normal bowel sounds, soft, non-tender, no noted masses. No HSM   Commons side effects, risks, benefits, and alternatives for medications and treatment plan prescribed today were discussed, and the patient expressed understanding of the given instructions. Patient is instructed to call or message via MyChart if he/she has any questions or concerns regarding our treatment plan. No barriers to understanding were identified. We discussed Red Flag symptoms and signs in detail. Patient expressed understanding regarding what to do in case of urgent or emergency type symptoms.   Medication list was reconciled, printed and provided to the patient in AVS. Patient instructions and summary information was reviewed with the patient as documented in the AVS. This note  was prepared with assistance of Conservation officer, historic buildings. Occasional wrong-word or sound-a-like substitutions may have occurred due to the inherent limitations of voice recognition software  This visit occurred during the SARS-CoV-2 public health emergency.  Safety protocols were in place, including screening questions prior to the visit, additional usage of staff PPE, and extensive cleaning of exam room while observing appropriate contact time as indicated for disinfecting solutions.

## 2019-12-12 NOTE — Patient Instructions (Signed)
Please return in 6 weeks for recheck.   I will release your lab results to you on your MyChart account with further instructions. Please reply with any questions.    It was a pleasure meeting you today! Thank you for choosing [place] to meet your healthcare needs! I truly look forward to working with you. If you have any questions or concerns, please send me a message via Mychart or call the office at [phone].

## 2019-12-13 LAB — COMPREHENSIVE METABOLIC PANEL
ALT: 16 U/L (ref 0–35)
AST: 16 U/L (ref 0–37)
Albumin: 4.3 g/dL (ref 3.5–5.2)
Alkaline Phosphatase: 53 U/L (ref 39–117)
BUN: 11 mg/dL (ref 6–23)
CO2: 28 mEq/L (ref 19–32)
Calcium: 9 mg/dL (ref 8.4–10.5)
Chloride: 104 mEq/L (ref 96–112)
Creatinine, Ser: 0.8 mg/dL (ref 0.40–1.20)
GFR: 76.31 mL/min (ref >60.00)
Glucose, Bld: 91 mg/dL (ref 70–99)
Potassium: 3.9 mEq/L (ref 3.5–5.1)
Sodium: 138 mEq/L (ref 135–145)
Total Bilirubin: 0.4 mg/dL (ref 0.2–1.2)
Total Protein: 7.1 g/dL (ref 6.0–8.3)

## 2019-12-13 LAB — CBC WITH DIFFERENTIAL/PLATELET
Basophils Absolute: 0 10*3/uL (ref 0.0–0.1)
Basophils Relative: 0.6 % (ref 0.0–3.0)
Eosinophils Absolute: 0.1 10*3/uL (ref 0.0–0.7)
Eosinophils Relative: 1.9 % (ref 0.0–5.0)
HCT: 40.7 % (ref 36.0–46.0)
Hemoglobin: 13.9 g/dL (ref 12.0–15.0)
Lymphocytes Relative: 37.3 % (ref 12.0–46.0)
Lymphs Abs: 2 10*3/uL (ref 0.7–4.0)
MCHC: 34.1 g/dL (ref 30.0–36.0)
MCV: 90.9 fl (ref 78.0–100.0)
Monocytes Absolute: 0.4 10*3/uL (ref 0.1–1.0)
Monocytes Relative: 7.6 % (ref 3.0–12.0)
Neutro Abs: 2.9 10*3/uL (ref 1.4–7.7)
Neutrophils Relative %: 52.6 % (ref 43.0–77.0)
Platelets: 288 10*3/uL (ref 150.0–400.0)
RBC: 4.47 Mil/uL (ref 3.87–5.11)
RDW: 12.2 % (ref 11.5–15.5)
WBC: 5.5 10*3/uL (ref 4.0–10.5)

## 2019-12-13 LAB — SEDIMENTATION RATE: Sed Rate: 5 mm/hr (ref 0–20)

## 2019-12-13 LAB — TSH: TSH: 0.64 u[IU]/mL (ref 0.35–4.50)

## 2020-01-23 ENCOUNTER — Telehealth (INDEPENDENT_AMBULATORY_CARE_PROVIDER_SITE_OTHER): Payer: 59 | Admitting: Family Medicine

## 2020-01-23 ENCOUNTER — Other Ambulatory Visit: Payer: Self-pay

## 2020-01-23 ENCOUNTER — Encounter: Payer: Self-pay | Admitting: Family Medicine

## 2020-01-23 DIAGNOSIS — K580 Irritable bowel syndrome with diarrhea: Secondary | ICD-10-CM | POA: Diagnosis not present

## 2020-01-23 MED ORDER — SERTRALINE HCL 50 MG PO TABS: 50.0000 mg | ORAL_TABLET | Freq: Every day | ORAL | 3 refills | Status: DC

## 2020-01-23 NOTE — Progress Notes (Signed)
Virtual Visit via Video Note  Subjective  CC:  Chief Complaint  Patient presents with  . Irritable Bowel Syndrome    patient states that since starting Zoloft, she has noticed an improvement with IBS symptoms     I connected with [name] on 01/23/20 at  4:30 PM EDT by a video enabled telemedicine application and verified that I am speaking with the correct person using two identifiers. Location patient: Home Location provider: [facility] [address] Office Persons participating in the virtual visit: JOIE HIPPS, Willow Ora, MD Adela Glimpse, CMA  I discussed the limitations of evaluation and management by telemedicine and the availability of in person appointments. The patient expressed understanding and agreed to proceed. HPI: Kelsey Johnson is a 49 y.o. female who was contacted today to address the problems listed above in the chief complaint. . F/u IBS and anxiety/stress. See last note. We started zoloft and she is 100% better. She remains on levsin and ibguard. Feeling better, can get around without worry about BMs now and does admit may feel less stressed overall - more even keeled. No AEs.  Assessment  1. Irritable bowel syndrome with diarrhea      Plan   IBS-D:  Much improved. Trial wean from levsin. Continue zoloft 50 and ibguard.   I discussed the assessment and treatment plan with the patient. The patient was provided an opportunity to ask questions and all were answered. The patient agreed with the plan and demonstrated an understanding of the instructions.   The patient was advised to call back or seek an in-person evaluation if the symptoms worsen or if the condition fails to improve as anticipated. Follow up: schedule cpe for summer  Visit date not found  Meds ordered this encounter  Medications  . sertraline (ZOLOFT) 50 MG tablet    Sig: Take 1 tablet (50 mg total) by mouth daily.    Dispense:  90 tablet    Refill:  3       I reviewed the patients updated PMH, FH, and SocHx.    Patient Active Problem List   Diagnosis Date Noted  . Palpitations 06/15/2017  . Inflammatory arthritis 05/19/2017  . 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 Asthma 07/27/2011  . Irritable bowel syndrome 07/27/2011  . SVT (supraventricular tachycardia) (HCC) 07/27/2011   Current Meds  Medication Sig  . hyoscyamine (LEVBID) 0.375 MG 12 hr tablet Take 1 tablet (0.375 mg total) by mouth daily.  . LO LOESTRIN FE 1 MG-10 MCG / 10 MCG tablet Take 1 tablet by mouth daily.   . propranolol (INDERAL) 10 MG tablet Take 1 tablet (10 mg total) by mouth 4 (four) times daily as needed (Palpitations/fast heart rate).    Allergies: Patient is allergic to sulfa antibiotics. Family History: Patient family history includes Alcoholism in her maternal grandfather; Asthma in her daughter; Diabetes in her father and paternal grandfather; Healthy in her daughter, daughter, and son; Heart disease in her maternal grandfather. Social History:  Patient  reports that she has never smoked. She has never used smokeless tobacco. She reports that she does not drink alcohol or use drugs.  Review of Systems: Constitutional: Negative for fever malaise or anorexia Cardiovascular: negative for chest pain Respiratory: negative for SOB or persistent cough Gastrointestinal: negative for abdominal pain  OBJECTIVE Vitals: There were no vitals taken for this visit. General: no acute distress , A&Ox3 Appears happy today  Audy Dauphine L  Jonni Sanger, MD

## 2020-03-10 ENCOUNTER — Encounter: Payer: Self-pay | Admitting: Gastroenterology

## 2020-03-11 ENCOUNTER — Encounter: Payer: Self-pay | Admitting: Family Medicine

## 2020-04-11 ENCOUNTER — Other Ambulatory Visit: Payer: Self-pay

## 2020-04-11 ENCOUNTER — Ambulatory Visit: Payer: 59 | Admitting: Physician Assistant

## 2020-04-11 ENCOUNTER — Encounter: Payer: Self-pay | Admitting: Physician Assistant

## 2020-04-11 VITALS — BP 118/78 | HR 70 | Temp 97.9°F | Ht 62.0 in | Wt 118.6 lb

## 2020-04-11 DIAGNOSIS — R399 Unspecified symptoms and signs involving the genitourinary system: Secondary | ICD-10-CM | POA: Diagnosis not present

## 2020-04-11 LAB — POCT URINALYSIS DIPSTICK
Bilirubin, UA: NEGATIVE
Glucose, UA: NEGATIVE
Ketones, UA: NEGATIVE
Leukocytes, UA: NEGATIVE
Nitrite, UA: NEGATIVE
Protein, UA: NEGATIVE
Spec Grav, UA: 1.01 (ref 1.010–1.025)
Urobilinogen, UA: 0.2 E.U./dL
pH, UA: 6 (ref 5.0–8.0)

## 2020-04-11 MED ORDER — CEPHALEXIN 500 MG PO CAPS
500.0000 mg | ORAL_CAPSULE | Freq: Three times a day (TID) | ORAL | 0 refills | Status: DC
Start: 2020-04-11 — End: 2020-08-18

## 2020-04-11 NOTE — Patient Instructions (Signed)
It was great to see you!  I have sent your antibiotic in should you need it.  I will be in touch with your culture results.  General instructions  Make sure you: ? Pee until your bladder is empty. ? Do not hold pee for a long time. ? Empty your bladder after sex. ? Wipe from front to back after pooping if you are a female. Use each tissue one time when you wipe.  Drink enough fluid to keep your pee pale yellow.  Keep all follow-up visits as told by your doctor. This is important. Contact a doctor if:  You do not get better after 1-2 days.  Your symptoms go away and then come back. Get help right away if:  You have very bad back pain.  You have very bad pain in your lower belly.  You have a fever.  You are sick to your stomach (nauseous).  You are throwing up.   Take care,  Jarold Motto PA-C

## 2020-04-11 NOTE — Progress Notes (Signed)
Kelsey Johnson is a 49 y.o. female here for a new problem.  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 Urinary Tract Infection    HPI   UTI Pt c/o urinary problems. Symptoms started on 04/10/2020. Experiencing frequency, slight burning and abdomen pressure. Denies back pain, fever, or chills.  Has been pushing water significantly since symptoms started which has resulted in symptom improvement.   Denies hx of pyelonephritis or kidney stones.  Leaving for [place] in less than 1 week.  Past Medical History:  Diagnosis Date  . Arthritis   . Asthma   . CTIN (chronic tubulo-interstitial nephritis)   . IBS (irritable bowel syndrome)   . IBS (irritable bowel syndrome)   . Kleine-Levin syndrome 2002  . Supraventricular tachycardia [facility]) 2011   Dr. Elease Hashimoto     Social History   Tobacco Use  . Smoking status: Never Smoker  . Smokeless tobacco: Never Used  Vaping Use  . Vaping Use: Never used  Substance Use Topics  . Alcohol use: No  . Drug use: No    Past Surgical History:  Procedure Laterality Date  . ADENOIDECTOMY     As a child  . TYMPANOSTOMY TUBE PLACEMENT     As a child    Family History  Problem Relation Age of Onset  . Diabetes Father   . Asthma Daughter   . Heart disease Maternal Grandfather        MI  . Alcoholism Maternal Grandfather   . Diabetes Paternal Grandfather   . Healthy Daughter   . Healthy Daughter   . Healthy Son     Allergies  Allergen Reactions  . Sulfa Antibiotics Other (See Comments)    Reaction unknown child    Current Medications:   Current Outpatient Medications:  .  BLISOVI FE 1/20 1-20 MG-MCG tablet, Take 1 tablet by mouth daily., Disp: , Rfl:  .  propranolol (INDERAL) 10 MG tablet, Take 1 tablet (10 mg total) by mouth 4 (four) times daily as needed (Palpitations/fast heart rate)., Disp: 60 tablet, Rfl: 11 .  sertraline (ZOLOFT)  50 MG tablet, Take 1 tablet (50 mg total) by mouth daily., Disp: 90 tablet, Rfl: 3 .  cephALEXin (KEFLEX) 500 MG capsule, Take 1 capsule (500 mg total) by mouth 3 (three) times daily., Disp: 15 capsule, Rfl: 0 .  hyoscyamine (LEVBID) 0.375 MG 12 hr tablet, Take 1 tablet (0.375 mg total) by mouth daily. (Patient not taking: Reported on 04/11/2020), Disp: 30 tablet, Rfl: 2   Review of Systems:   ROS Negative unless otherwise specified per HPI.  Vitals:   Vitals:   04/11/20 1547  BP: 118/78  Pulse: 70  Temp: 97.9 F (36.6 C)  TempSrc: Temporal  SpO2: 99%  Weight: 118 lb 9.6 oz (53.8 kg)  Height: 5\' 2"  (1.575 m)     Body mass index is 21.69 kg/m.  Physical Exam:   Physical Exam Vitals and nursing note reviewed.  Constitutional:      General: She is not in acute distress.    Appearance: Normal appearance. She is well-developed. She is not ill-appearing or toxic-appearing.  Cardiovascular:     Rate and Rhythm: Normal rate and regular rhythm.     Pulses: Normal pulses.     Heart sounds: Normal heart sounds, S1 normal and S2 normal.  Pulmonary:     Effort: Pulmonary effort is normal.  Breath sounds: Normal breath sounds.  Abdominal:     General: Bowel sounds are normal.     Tenderness: There is no abdominal tenderness.  Skin:    General: Skin is warm and dry.  Neurological:     Mental Status: She is alert.     GCS: GCS eye subscore is 4. GCS verbal subscore is 5. GCS motor subscore is 6.  Psychiatric:        Speech: Speech normal.        Behavior: Behavior normal. Behavior is cooperative.     Results for orders placed or performed in visit on 04/11/20  POCT Urinalysis Dipstick  Result Value Ref Range   Color, UA straw    Clarity, UA clear    Glucose, UA Negative Negative   Bilirubin, UA negative    Ketones, UA negative    Spec Grav, UA 1.010 1.010 - 1.025   Blood, UA 1+    pH, UA 6.0 5.0 - 8.0   Protein, UA Negative Negative   Urobilinogen, UA 0.2 0.2 or 1.0  E.U./dL   Nitrite, UA negative    Leukocytes, UA Negative Negative   Appearance     Odor      Assessment and Plan:   Kelsey Johnson was seen today for urinary tract infection.  Diagnoses and all orders for this visit:  UTI symptoms UA is without obvious infection. Since her symptoms are improving, we have decided to hold treatment until culture has returned. However, if she were to worsen over the weekend, I have sent in oral keflex for her to take. Worsening precautions advised. Will contact patient with urine culture results once they have returned. Push fluids. -     Cancel: Urine Culture; Future -     Cancel: POCT Urinalysis Dipstick; Future -     POCT Urinalysis Dipstick -     Urine Culture  Other orders -     cephALEXin (KEFLEX) 500 MG capsule; Take 1 capsule (500 mg total) by mouth 3 (three) times daily.   . Reviewed expectations re: course of current medical issues. . Discussed self-management of symptoms. . Outlined signs and symptoms indicating need for more acute intervention. . Patient verbalized understanding and all questions were answered. . See orders for this visit as documented in the electronic medical record. . Patient received an After-Visit Summary.  CMA or LPN served as scribe during this visit. History, Physical, and Plan performed by medical provider. The above documentation has been reviewed and is accurate and complete.  Jarold Motto, PA-C

## 2020-04-12 LAB — URINE CULTURE
MICRO NUMBER:: 10686361
Result:: NO GROWTH
SPECIMEN QUALITY:: ADEQUATE

## 2020-04-15 ENCOUNTER — Encounter: Payer: Self-pay | Admitting: Gastroenterology

## 2020-04-15 ENCOUNTER — Ambulatory Visit (AMBULATORY_SURGERY_CENTER): Payer: Self-pay

## 2020-04-15 ENCOUNTER — Other Ambulatory Visit: Payer: Self-pay

## 2020-04-15 VITALS — Ht 62.0 in | Wt 119.6 lb

## 2020-04-15 DIAGNOSIS — Z01818 Encounter for other preprocedural examination: Secondary | ICD-10-CM

## 2020-04-15 DIAGNOSIS — Z1211 Encounter for screening for malignant neoplasm of colon: Secondary | ICD-10-CM

## 2020-04-15 NOTE — Progress Notes (Signed)
No egg or soy allergy known to patient  No issues with past sedation with any surgeries or procedures no intubation problems in the past  No diet pills per patient No home 02 use per patient  No blood thinners per patient  Pt denies issues with constipation  No A fib or A flutter  EMMI video to MyChart  COVID 19 guidelines implemented in [facility] today   COVID vaccine scheduled on 04/24/2020 at 9:40 am;patient aware;  Due to the COVID-19 pandemic we are asking patients to follow these guidelines. Please only bring one care partner. Please be aware that your care partner may wait in the car in the parking lot or if they feel like they will be too hot to wait in the car, they may wait in the lobby on the 4th floor. All care partners are required to wear a mask the entire time (we do not have any that we can provide them), they need to practice social distancing, and we will do a Covid check for all patient's and care partners when you arrive. Also we will check their temperature and your temperature. If the care partner waits in their car they need to stay in the parking lot the entire time and we will call them on their cell phone when the patient is ready for discharge so they can bring the car to the front of the building. Also all patient's will need to wear a mask into building.

## 2020-04-24 ENCOUNTER — Other Ambulatory Visit: Payer: Self-pay | Admitting: Gastroenterology

## 2020-04-24 ENCOUNTER — Ambulatory Visit (INDEPENDENT_AMBULATORY_CARE_PROVIDER_SITE_OTHER): Payer: 59

## 2020-04-24 DIAGNOSIS — Z1159 Encounter for screening for other viral diseases: Secondary | ICD-10-CM

## 2020-04-24 LAB — SARS CORONAVIRUS 2 (TAT 6-24 HRS): SARS Coronavirus 2: NEGATIVE

## 2020-04-28 ENCOUNTER — Encounter: Payer: Self-pay | Admitting: Gastroenterology

## 2020-04-28 ENCOUNTER — Other Ambulatory Visit: Payer: Self-pay

## 2020-04-28 ENCOUNTER — Ambulatory Visit (AMBULATORY_SURGERY_CENTER): Payer: 59 | Admitting: Gastroenterology

## 2020-04-28 VITALS — BP 155/86 | HR 70 | Temp 98.9°F | Resp 22 | Ht 62.0 in | Wt 119.0 lb

## 2020-04-28 DIAGNOSIS — Z1211 Encounter for screening for malignant neoplasm of colon: Secondary | ICD-10-CM

## 2020-04-28 DIAGNOSIS — R197 Diarrhea, unspecified: Secondary | ICD-10-CM | POA: Diagnosis not present

## 2020-04-28 DIAGNOSIS — K52832 Lymphocytic colitis: Secondary | ICD-10-CM | POA: Diagnosis not present

## 2020-04-28 MED ORDER — SODIUM CHLORIDE 0.9 % IV SOLN: 500.0000 mL | Freq: Once | INTRAVENOUS | Status: DC

## 2020-04-28 NOTE — Progress Notes (Signed)
To PACU, VSS. Report to [email or web address] 

## 2020-04-28 NOTE — Progress Notes (Signed)
Pt's states no medical or surgical changes since previsit or office visit.  CW - vitals 

## 2020-04-28 NOTE — Progress Notes (Signed)
Called to room to assist during endoscopic procedure.  Patient ID and intended procedure confirmed with present staff. Received instructions for my participation in the procedure from the performing physician.  

## 2020-04-28 NOTE — Patient Instructions (Signed)
YOU HAD AN ENDOSCOPIC PROCEDURE TODAY AT THE [facility]:   Refer to the procedure report that was given to you for any specific questions about what was found during the examination.  If the procedure report does not answer your questions, please call your gastroenterologist to clarify.  If you requested that your care partner not be given the details of your procedure findings, then the procedure report has been included in a sealed envelope for you to review at your convenience later.  YOU SHOULD EXPECT: Some feelings of bloating in the abdomen. Passage of more gas than usual.  Walking can help get rid of the air that was put into your GI tract during the procedure and reduce the bloating. If you had a lower endoscopy (such as a colonoscopy or flexible sigmoidoscopy) you may notice spotting of blood in your stool or on the toilet paper. If you underwent a bowel prep for your procedure, you may not have a normal bowel movement for a few days.  Please Note:  You might notice some irritation and congestion in your nose or some drainage.  This is from the oxygen used during your procedure.  There is no need for concern and it should clear up in a day or so.  SYMPTOMS TO REPORT IMMEDIATELY:   Following lower endoscopy (colonoscopy or flexible sigmoidoscopy):  Excessive amounts of blood in the stool  Significant tenderness or worsening of abdominal pains  Swelling of the abdomen that is new, acute  Fever of 100F or higher  For urgent or emergent issues, a gastroenterologist can be reached at any hour by calling [phone]. Do not use MyChart messaging for urgent concerns.    DIET:  We do recommend a small meal at first, but then you may proceed to your regular diet.  Drink plenty of fluids but you should avoid alcoholic beverages for 24 hours.  ACTIVITY:  You should plan to take it easy for the rest of today and you should NOT DRIVE or use heavy machinery until tomorrow (because  of the sedation medicines used during the test).    FOLLOW UP: Our staff will call the number listed on your records 48-72 hours following your procedure to check on you and address any questions or concerns that you may have regarding the information given to you following your procedure. If we do not reach you, we will leave a message.  We will attempt to reach you two times.  During this call, we will ask if you have developed any symptoms of COVID 19. If you develop any symptoms (ie: fever, flu-like symptoms, shortness of breath, cough etc.) before then, please call [phone].  If you test positive for Covid 19 in the 2 weeks post procedure, please call and report this information to us.    If any biopsies were taken you will be contacted by phone or by letter within the next 1-3 weeks.  Please call us at [phone] if you have not heard about the biopsies in 3 weeks.    SIGNATURES/CONFIDENTIALITY: You and/or your care partner have signed paperwork which will be entered into your electronic medical record.  These signatures attest to the fact that that the information above on your After Visit Summary has been reviewed and is understood.  Full responsibility of the confidentiality of this discharge information lies with you and/or your care-partner. 

## 2020-04-28 NOTE — Op Note (Signed)
[facility] Patient Name: Kelsey Johnson Procedure Date: 04/28/2020 11:29 AM MRN: [id number] Endoscopist: Sherilyn Cooter L. Myrtie Neither , MD Age: 49 Referring MD:  Date of Birth: [birth year] Gender: Female Account #: [id number] Procedure:                Colonoscopy Indications:              Screening for colorectal malignant neoplasm, This                            is the patient's first colonoscopy, Incidental                            diarrhea noted (probable IBS-D, improved in recent                            months on sertraline started by PCP) Medicines:                Monitored Anesthesia Care Procedure:                Pre-Anesthesia Assessment:                           - Prior to the procedure, a History and Physical                            was performed, and patient medications and                            allergies were reviewed. The patient's tolerance of                            previous anesthesia was also reviewed. The risks                            and benefits of the procedure and the sedation                            options and risks were discussed with the patient.                            All questions were answered, and informed consent                            was obtained. Prior Anticoagulants: The patient has                            taken no previous anticoagulant or antiplatelet                            agents. ASA Grade Assessment: II - A patient with                            mild systemic disease. After reviewing the risks  and benefits, the patient was deemed in                            satisfactory condition to undergo the procedure.                           After obtaining informed consent, the colonoscope                            was passed under direct vision. Throughout the                            procedure, the patient's blood pressure, pulse, and                            oxygen saturations were  monitored continuously. The                            Colonoscope was introduced through the anus and                            advanced to the the terminal ileum, with                            identification of the appendiceal orifice and IC                            valve. The colonoscopy was performed without                            difficulty. The patient tolerated the procedure                            well. The quality of the bowel preparation was                            good. The terminal ileum, ileocecal valve,                            appendiceal orifice, and rectum were photographed.                            The bowel preparation used was Miralax. Scope In: 11:49:49 AM Scope Out: 12:04:43 PM Scope Withdrawal Time: 0 hours 10 minutes 17 seconds  Total Procedure Duration: 0 hours 14 minutes 54 seconds  Findings:                 The perianal and digital rectal examinations were                            normal.                           The terminal ileum appeared normal.  Normal mucosa was found in the entire colon.                            Biopsies for histology were taken with a cold                            forceps from the right colon and left colon for                            evaluation of microscopic colitis.                           There is no endoscopic evidence of polyps in the                            entire colon.                           The retroflexed view of the distal rectum and anal                            verge was normal and showed no anal or rectal                            abnormalities. Complications:            No immediate complications. Estimated Blood Loss:     Estimated blood loss was minimal. Impression:               - The examined portion of the ileum was normal.                           - Normal mucosa in the entire examined colon.                            Biopsied.                            - The distal rectum and anal verge are normal on                            retroflexion view. Recommendation:           - Patient has a contact number available for                            emergencies. The signs and symptoms of potential                            delayed complications were discussed with the                            patient. Return to normal activities tomorrow.                            Written discharge  instructions were provided to the                            patient.                           - Resume previous diet.                           - Continue present medications.                           - Await pathology results.                           - Repeat colonoscopy in 10 years for screening                            purposes. Phoenix Dresser L. Myrtie Neitheranis, MD 04/28/2020 12:09:16 PM This report has been signed electronically.

## 2020-04-30 ENCOUNTER — Telehealth: Payer: Self-pay

## 2020-04-30 ENCOUNTER — Telehealth: Payer: Self-pay | Admitting: *Deleted

## 2020-04-30 NOTE — Telephone Encounter (Signed)
  Follow up Call-  Call back number 04/28/2020  Post procedure Call Back phone  # [phone]  Permission to leave phone message Yes  Some recent data might be hidden     Patient questions:  Do you have a fever, pain , or abdominal swelling? No. Pain Score  0 *  Have you tolerated food without any problems? Yes.    Have you been able to return to your normal activities? Yes.    Do you have any questions about your discharge instructions: Diet   No. Medications  No. Follow up visit  No.  Do you have questions or concerns about your Care? No.  Actions: * If pain score is 4 or above: No action needed, pain <4.  1. Have you developed a fever since your procedure? no  2.   Have you had an respiratory symptoms (SOB or cough) since your procedure? no  3.   Have you tested positive for COVID 19 since your procedure no  4.   Have you had any family members/close contacts diagnosed with the COVID 19 since your procedure?  no   If yes to any of these questions please route to Laverna Peace, RN and Charlett Lango, RN

## 2020-04-30 NOTE — Telephone Encounter (Signed)
Follow up call made. 

## 2020-05-02 ENCOUNTER — Other Ambulatory Visit: Payer: Self-pay | Admitting: *Deleted

## 2020-05-02 MED ORDER — BUDESONIDE 3 MG PO CPEP
ORAL_CAPSULE | ORAL | 1 refills | Status: DC
Start: 2020-05-02 — End: 2020-07-02

## 2020-05-14 ENCOUNTER — Other Ambulatory Visit: Payer: Self-pay

## 2020-05-14 MED ORDER — SERTRALINE HCL 50 MG PO TABS: 50.0000 mg | ORAL_TABLET | Freq: Every day | ORAL | 3 refills | Status: DC

## 2020-06-29 ENCOUNTER — Other Ambulatory Visit: Payer: Self-pay | Admitting: Gastroenterology

## 2020-06-30 NOTE — Telephone Encounter (Signed)
Incoming refill request. I see where on the last communication there was discussion of a taper. Pt has a follow up scheduled for 08-14-2020

## 2020-08-06 ENCOUNTER — Ambulatory Visit: Payer: 59 | Admitting: Gastroenterology

## 2020-08-18 ENCOUNTER — Ambulatory Visit: Payer: 59 | Admitting: Gastroenterology

## 2020-08-18 ENCOUNTER — Encounter: Payer: Self-pay | Admitting: Gastroenterology

## 2020-08-18 VITALS — BP 122/80 | HR 84 | Ht 62.0 in | Wt 129.0 lb

## 2020-08-18 DIAGNOSIS — K529 Noninfective gastroenteritis and colitis, unspecified: Secondary | ICD-10-CM | POA: Diagnosis not present

## 2020-08-18 DIAGNOSIS — K52832 Lymphocytic colitis: Secondary | ICD-10-CM

## 2020-08-18 NOTE — Patient Instructions (Signed)
If you are age 49 or older, your body mass index should be between 23-30. Your Body mass index is 23.59 kg/m. If this is out of the aforementioned range listed, please consider follow up with your Primary Care Provider.  If you are age 84 or younger, your body mass index should be between 19-25. Your Body mass index is 23.59 kg/m. If this is out of the aformentioned range listed, please consider follow up with your Primary Care Provider.   Please taper the Budesonide as discussed.  It was a pleasure to see you today!  Dr. Myrtie Neither

## 2020-08-18 NOTE — Progress Notes (Signed)
     [name]  Chief Complaint: Microscopic colitis  Subjective  History: Seen in 2019, 2020, and earlier this year for chronic diarrhea with a diagnosis of IBS.  She had previously put off having a colonoscopy, but was agreeable to it several months ago.  Colonoscopy 04/28/2020 normal to the terminal ileum, biopsies positive for lymphocytic colitis, after which patient was started on budesonide.  Kelsey Johnson has been on budesonide 9 mg daily for the last 3 months and glad reports she is feeling well.  She will have a BM 1-2 times daily on average, but was sometimes skip a day.  Stool is formed and no longer loose.  She denies rectal bleeding or abdominal pain.  Appetite is good, and she has gained some weight and is unsure if it is from the budesonide, Zoloft or menopause.  Zoloft was started early summer out of concern that she might have an anxiety trigger for suspected IBS, but she has not really felt much different on it.  She is planning to see primary care very soon and will discuss tapering off it.  ROS: Cardiovascular:  no chest pain Respiratory: no dyspnea  The patient's Past Medical, Family and Social History were reviewed and are on file in the EMR.  Objective:  Med list reviewed  Current Outpatient Medications:  .  budesonide (ENTOCORT EC) 3 MG 24 hr capsule, TAKE 3 CAPSULES BY MOUTH DAILY, Disp: 90 capsule, Rfl: 1 .  Norethin-Eth Estrad-Fe Biphas (LO LOESTRIN FE PO), Take by mouth daily., Disp: , Rfl:  .  sertraline (ZOLOFT) 50 MG tablet, Take 1 tablet (50 mg total) by mouth daily., Disp: 90 tablet, Rfl: 3   Vital signs in last 24 hrs: Vitals:   08/18/20 1511  BP: 122/80  Pulse: 84    Physical Exam  Well-appearing  HEENT: sclera anicteric, oral mucosa moist without lesions  Neck: supple, no thyromegaly, JVD or lymphadenopathy  Cardiac: RRR without murmurs, S1S2 heard, no peripheral edema  Pulm: clear to auscultation bilaterally, normal RR and  effort noted  Abdomen: soft, no tenderness, with active bowel sounds. No guarding or palpable hepatosplenomegaly.  Skin; warm and dry, no jaundice or rash  Labs:   ___________________________________________ Radiologic studies:   ____________________________________________ Other:  Pathology showing lymphocytic colitis  _____________________________________________ Assessment & Plan  Assessment: Encounter Diagnoses  Name Primary?  . Lymphocytic colitis Yes  . Chronic diarrhea     Lymphocytic colitis, was not on any known trigger medicines, tested negative for celiac antibodies in February 2020.  Good response to budesonide.  We discussed the variable natural history of this and need to taper off meds to reassess symptoms.  Plan: Starting tomorrow, decrease budesonide to 6 mg daily. After 2 to 3 weeks, decrease to 3 mg daily.  If no return of diarrhea, stop medicine 2 or 3 weeks after.  She will keep in touch with me by portal message over the next couple of months.   20 Minutes were spent on this encounter (including chart review, history/exam, counseling/coordination of care, and documentation)  Charlie Pitter III

## 2020-08-27 ENCOUNTER — Encounter: Payer: Self-pay | Admitting: Family Medicine

## 2020-08-27 ENCOUNTER — Ambulatory Visit (INDEPENDENT_AMBULATORY_CARE_PROVIDER_SITE_OTHER): Payer: 59 | Admitting: Family Medicine

## 2020-08-27 ENCOUNTER — Other Ambulatory Visit: Payer: Self-pay

## 2020-08-27 VITALS — BP 114/72 | HR 78 | Temp 97.6°F | Ht 62.0 in | Wt 124.8 lb

## 2020-08-27 DIAGNOSIS — Z0000 Encounter for general adult medical examination without abnormal findings: Secondary | ICD-10-CM | POA: Diagnosis not present

## 2020-08-27 DIAGNOSIS — I471 Supraventricular tachycardia: Secondary | ICD-10-CM

## 2020-08-27 DIAGNOSIS — K580 Irritable bowel syndrome with diarrhea: Secondary | ICD-10-CM

## 2020-08-27 DIAGNOSIS — Z23 Encounter for immunization: Secondary | ICD-10-CM | POA: Diagnosis not present

## 2020-08-27 DIAGNOSIS — K52832 Lymphocytic colitis: Secondary | ICD-10-CM

## 2020-08-27 DIAGNOSIS — R519 Headache, unspecified: Secondary | ICD-10-CM

## 2020-08-27 DIAGNOSIS — N951 Menopausal and female climacteric states: Secondary | ICD-10-CM

## 2020-08-27 DIAGNOSIS — Z3041 Encounter for surveillance of contraceptive pills: Secondary | ICD-10-CM

## 2020-08-27 MED ORDER — BUDESONIDE 3 MG PO CPEP: ORAL_CAPSULE | ORAL | 1 refills | Status: DC

## 2020-08-27 NOTE — Patient Instructions (Signed)
Please return in 3-6 months to discuss weaning zoloft and recheck headaches.  Sooner if headaches are worsening.   I will release your lab results to you on your MyChart account with further instructions. Please reply with any questions.   Today you were given your flu vaccination.   If you have any questions or concerns, please don't hesitate to send me a message via MyChart or call the office at [phone]. Thank you for visiting with Kelsey Johnson today! It's our pleasure caring for you.

## 2020-08-27 NOTE — Progress Notes (Signed)
Subjective  Chief Complaint  Patient presents with  . Annual Exam    non-fasting  . Migraine    has noticed she is getting migraines more often  . Health Maintenance    flu shot given in office today, has not received COVID vaccine, has been seen by OBGYN for annual this year    HPI: Kelsey Johnson is a 49 y.o. female who presents to [facility] [place] today for a Female Wellness Visit. She also has the concerns and/or needs as listed above in the chief complaint. These will be addressed in addition to the Health Maintenance Visit.   Wellness Visit: annual visit with health maintenance review and exam without Pap   HM: sees gyn for pap and mamm; up to date. Chronic disease f/u and/or acute problem visit: (deemed necessary to be done in addition to the wellness visit):  OCP use: still with some hot flushes. No AEs. Still on ocps but not sure which brand.   IBS and dxd lymphocytic colitis by biopsy much improved on bedensonide.   On sertraline for IBS and stress: all stable now.   C/o intermittent but worsening headaches w/o red flag sxs. No neck stiffness, vision changes, neurologic deficits, severe headaches, n/v/ or photophobia. Denies stressors.   SVT is stable   Assessment  1. Annual physical exam   2. Irritable bowel syndrome with diarrhea   3. SVT (supraventricular tachycardia) (HCC)   4. Lymphocytic colitis   5. Worsening headaches   6. Oral contraceptive use   7. Perimenopausal   8. Need for immunization against influenza      Plan  Female Wellness Visit:  Age appropriate Health Maintenance and Prevention measures were discussed with patient. Included topics are cancer screening recommendations, ways to keep healthy (see AVS) including dietary and exercise recommendations, regular eye and dental care, use of seat belts, and avoidance of moderate alcohol use and tobacco use. Screens are up to date. Will need crc screen next year.   BMI:  discussed patient's BMI and encouraged positive lifestyle modifications to help get to or maintain a target BMI.  HM needs and immunizations were addressed and ordered. See below for orders. See HM and immunization section for updates. Flu shot today  Routine labs and screening tests ordered including cmp, cbc and lipids where appropriate.  Discussed recommendations regarding Vit D and calcium supplementation (see AVS)  Chronic disease management visit and/or acute problem visit:  Continue ocps in the perimenopausal state. F/u with gyn.   Headaches: ? Related to perimenopause or other. Discussed red flag sxs. Discussed rest, good hydration, avoidance of caffeine withdrawal or skipping meals and f/u with me if worsening. To ER for severe headache.   Colitis: improved on steroids; to be weaned by GI. IBS is stable. contiue sertraline. Continue discontinuation after the holidays  Svt is stable.    Follow up: No follow-ups on file.  Orders Placed This Encounter  Procedures  . Flu Vaccine QUAD 36+ mos IM  . CBC with Differential/Platelet  . COMPLETE METABOLIC PANEL WITH GFR  . Lipid panel  . TSH   Meds ordered this encounter  Medications  . budesonide (ENTOCORT EC) 3 MG 24 hr capsule    Sig: TAKE 2 CAPSULES BY MOUTH DAILY    Dispense:  90 capsule    Refill:  1      Lifestyle: Body mass index is 22.83 kg/m. Wt Readings from Last 3 Encounters:  08/27/20 124 lb 12.8  oz (56.6 kg)  08/18/20 129 lb (58.5 kg)  04/28/20 119 lb (54 kg)     Patient Active Problem List   Diagnosis Date Noted  . Lymphocytic colitis 08/27/2020  . Oral contraceptive use 08/27/2020  . Perimenopausal 08/27/2020  . Palpitations 06/15/2017  . Inflammatory arthritis 05/19/2017    Followed at [facility]. 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 Controlled on Celebrex. 05/19/2017    . 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 Asthma 07/27/2011  . Irritable bowel syndrome 07/27/2011  . SVT  (supraventricular tachycardia) (HCC) 07/27/2011   Health Maintenance  Topic Date Due  . Hepatitis C Screening  Never done  . COVID-19 Vaccine (1) 09/12/2020 (Originally 03/10/1983)  . TETANUS/TDAP  07/27/2021  . PAP SMEAR-Modifier  06/28/2023  . INFLUENZA VACCINE  Completed  . HIV Screening  Completed   Immunization History  Administered Date(s) Administered  . Influenza,inj,Quad PF,6+ Mos 07/13/2018, 07/24/2019, 08/27/2020  . PPD Test 04/26/2017  . Tdap 07/28/2011   We updated and reviewed the patient's past history in detail and it is documented below. Allergies: Patient is allergic to sulfa antibiotics. Past Medical History Patient  has a past medical history of Anxiety, Arthritis, Asthma, CTIN (chronic tubulo-interstitial nephritis), IBS (irritable bowel syndrome), Kleine-Levin syndrome (2002), Microscopic colitis, and Supraventricular tachycardia (HCC) (2011). Past Surgical History Patient  has a past surgical history that includes Tympanostomy tube placement and Adenoidectomy. Family History: Patient family history includes Alcoholism in her maternal grandfather; Asthma in her daughter; Diabetes in her father and paternal grandfather; Healthy in her daughter, daughter, and son; Heart disease in her maternal grandfather. Social History:  Patient  reports that she has never smoked. She has never used smokeless tobacco. She reports that she does not drink alcohol and does not use drugs.  Review of Systems: Constitutional: negative for fever or malaise Ophthalmic: negative for photophobia, double vision or loss of vision Cardiovascular: negative for chest pain, dyspnea on exertion, or new LE swelling Respiratory: negative for SOB or persistent cough Gastrointestinal: negative for abdominal pain, change in bowel habits or melena Genitourinary: negative for dysuria or gross hematuria, no abnormal uterine bleeding or disharge Musculoskeletal: negative for new gait disturbance or  muscular weakness Integumentary: negative for new or persistent rashes, no breast lumps Neurological: negative for TIA or stroke symptoms Psychiatric: negative for SI or delusions Allergic/Immunologic: negative for hives  Patient Care Team    Relationship Specialty Notifications Start End  Willow Ora, MD PCP - General Family Medicine  12/12/19   Nigel Bridgeman, CNM Midwife Obstetrics and Gynecology  05/14/19   Sherrilyn Rist, MD Consulting Physician Gastroenterology  12/12/19   Nahser, Deloris Ping, MD Consulting Physician Cardiology  12/12/19     Objective  Vitals: BP 114/72   Pulse 78   Temp 97.6 F (36.4 C) (Temporal)   Ht 5\' 2"  (1.575 m)   Wt 124 lb 12.8 oz (56.6 kg)   SpO2 98%   BMI 22.83 kg/m  General:  Well developed, well nourished, no acute distress  Psych:  Alert and orientedx3,normal mood and affect HEENT:  Normocephalic, atraumatic, non-icteric sclera,  supple neck without adenopathy, mass or thyromegaly Cardiovascular:  Normal S1, S2, RRR without gallop, rub or murmur Respiratory:  Good breath sounds bilaterally, CTAB with normal respiratory effort Gastrointestinal: normal bowel sounds, soft, non-tender, no noted masses. No HSM MSK: no deformities, contusions. Joints are without erythema or swelling.  Skin:  Warm, no rashes or suspicious lesions noted Neurologic:    Mental  status is normal. CN 2-11 are normal. Gross motor and sensory exams are normal. Normal gait. No tremor Breast Exam: No mass, skin retraction or nipple discharge is appreciated in either breast. No axillary adenopathy. Fibrocystic changes are not noted    Commons side effects, risks, benefits, and alternatives for medications and treatment plan prescribed today were discussed, and the patient expressed understanding of the given instructions. Patient is instructed to call or message via MyChart if he/she has any questions or concerns regarding our treatment plan. No barriers to understanding were  identified. We discussed Red Flag symptoms and signs in detail. Patient expressed understanding regarding what to do in case of urgent or emergency type symptoms.   Medication list was reconciled, printed and provided to the patient in AVS. Patient instructions and summary information was reviewed with the patient as documented in the AVS. This note was prepared with assistance of Dragon voice recognition software. Occasional wrong-word or sound-a-like substitutions may have occurred due to the inherent limitations of voice recognition software  This visit occurred during the SARS-CoV-2 public health emergency.  Safety protocols were in place, including screening questions prior to the visit, additional usage of staff PPE, and extensive cleaning of exam room while observing appropriate contact time as indicated for disinfecting solutions.

## 2020-08-28 LAB — COMPLETE METABOLIC PANEL WITH GFR
AG Ratio: 1.7 (calc) (ref 1.0–2.5)
ALT: 15 U/L (ref 6–29)
AST: 16 U/L (ref 10–35)
Albumin: 4.1 g/dL (ref 3.6–5.1)
Alkaline phosphatase (APISO): 49 U/L (ref 31–125)
BUN: 11 mg/dL (ref 7–25)
CO2: 25 mmol/L (ref 20–32)
Calcium: 9.2 mg/dL (ref 8.6–10.2)
Chloride: 103 mmol/L (ref 98–110)
Creat: 0.84 mg/dL (ref 0.50–1.10)
GFR, Est African American: 95 mL/min/{1.73_m2} (ref >=60)
GFR, Est Non African American: 82 mL/min/{1.73_m2} (ref >=60)
Globulin: 2.4 g/dL (calc) (ref 1.9–3.7)
Glucose, Bld: 74 mg/dL (ref 65–99)
Potassium: 4.1 mmol/L (ref 3.5–5.3)
Sodium: 136 mmol/L (ref 135–146)
Total Bilirubin: 0.4 mg/dL (ref 0.2–1.2)
Total Protein: 6.5 g/dL (ref 6.1–8.1)

## 2020-08-28 LAB — LIPID PANEL
Cholesterol: 189 mg/dL (ref <200)
HDL: 44 mg/dL — ABNORMAL LOW (ref >=50)
LDL Cholesterol (Calc): 117 mg/dL (calc) — ABNORMAL HIGH
Non-HDL Cholesterol (Calc): 145 mg/dL (calc) — ABNORMAL HIGH (ref <130)
Total CHOL/HDL Ratio: 4.3 (calc) (ref <5.0)
Triglycerides: 161 mg/dL — ABNORMAL HIGH (ref <150)

## 2020-08-28 LAB — CBC WITH DIFFERENTIAL/PLATELET
Absolute Monocytes: 593 cells/uL (ref 200–950)
Basophils Absolute: 7 cells/uL (ref 0–200)
Basophils Relative: 0.1 %
Eosinophils Absolute: 62 cells/uL (ref 15–500)
Eosinophils Relative: 0.9 %
HCT: 43.7 % (ref 35.0–45.0)
Hemoglobin: 14.7 g/dL (ref 11.7–15.5)
Lymphs Abs: 1849 cells/uL (ref 850–3900)
MCH: 31.7 pg (ref 27.0–33.0)
MCHC: 33.6 g/dL (ref 32.0–36.0)
MCV: 94.2 fL (ref 80.0–100.0)
MPV: 9.7 fL (ref 7.5–12.5)
Monocytes Relative: 8.6 %
Neutro Abs: 4388 cells/uL (ref 1500–7800)
Neutrophils Relative %: 63.6 %
Platelets: 350 10*3/uL (ref 140–400)
RBC: 4.64 10*6/uL (ref 3.80–5.10)
RDW: 11.3 % (ref 11.0–15.0)
Total Lymphocyte: 26.8 %
WBC: 6.9 10*3/uL (ref 3.8–10.8)

## 2020-08-28 LAB — TSH: TSH: 0.97 mIU/L

## 2020-09-03 ENCOUNTER — Encounter: Payer: Self-pay | Admitting: Family Medicine

## 2020-09-03 NOTE — Telephone Encounter (Signed)
I recommend head ct for persistent headaches, worsening over time in severity. Awakens at night with headache.   Please order.  Trial of meloxicam 15mg  daily as needed. Please order # 30.

## 2020-09-04 ENCOUNTER — Other Ambulatory Visit: Payer: Self-pay

## 2020-09-04 ENCOUNTER — Other Ambulatory Visit: Payer: Self-pay | Admitting: Gastroenterology

## 2020-09-04 DIAGNOSIS — R519 Headache, unspecified: Secondary | ICD-10-CM

## 2020-09-04 MED ORDER — MELOXICAM 15 MG PO TABS: 15.0000 mg | ORAL_TABLET | Freq: Every day | ORAL | 0 refills | Status: DC

## 2020-09-24 ENCOUNTER — Other Ambulatory Visit: Payer: 59

## 2020-10-23 ENCOUNTER — Other Ambulatory Visit: Payer: Self-pay

## 2020-10-23 MED ORDER — SERTRALINE HCL 50 MG PO TABS
50.0000 mg | ORAL_TABLET | Freq: Every day | ORAL | 3 refills | Status: DC
Start: 2020-10-23 — End: 2020-11-04

## 2020-11-04 ENCOUNTER — Other Ambulatory Visit: Payer: Self-pay

## 2020-11-04 MED ORDER — SERTRALINE HCL 50 MG PO TABS
50.0000 mg | ORAL_TABLET | Freq: Every day | ORAL | 3 refills | Status: DC
Start: 2020-11-04 — End: 2021-11-09

## 2020-12-15 ENCOUNTER — Ambulatory Visit: Payer: 59 | Admitting: Family Medicine

## 2020-12-17 ENCOUNTER — Ambulatory Visit: Payer: 59 | Admitting: Family Medicine

## 2021-01-14 ENCOUNTER — Other Ambulatory Visit: Payer: Self-pay

## 2021-01-14 ENCOUNTER — Ambulatory Visit: Payer: 59 | Admitting: Family Medicine

## 2021-01-14 ENCOUNTER — Encounter: Payer: Self-pay | Admitting: Family Medicine

## 2021-01-14 VITALS — BP 130/82 | HR 79 | Temp 98.2°F | Ht 62.0 in | Wt 130.4 lb

## 2021-01-14 DIAGNOSIS — R635 Abnormal weight gain: Secondary | ICD-10-CM

## 2021-01-14 DIAGNOSIS — K52832 Lymphocytic colitis: Secondary | ICD-10-CM | POA: Diagnosis not present

## 2021-01-14 DIAGNOSIS — F430 Acute stress reaction: Secondary | ICD-10-CM

## 2021-01-14 NOTE — Progress Notes (Signed)
Subjective  CC:  Chief Complaint  Patient presents with  . Medication Management    Wanting to start weaning off Zoloft  . Headache    Denies any recent headaches     HPI: Kelsey Johnson is a 50 y.o. female who presents to the office today to address the problems listed above in the chief complaint.  50 year old female who was started on Zoloft last year for stress reaction and possible IBS.  Since then, she was diagnosed with lymphocytic colitis and treated appropriately with steroids.  This has resolved.  She no longer has stomach issues.  Stress has improved as well.  No longer having stress related issues.  She would like to wean off the Zoloft.  She feels she has gained weight and wonders if this is due to Zoloft.   Assessment  1. Stress reaction   2. Lymphocytic colitis   3. Weight gain      Plan   Stress reaction: Resolved.  See after visit summary for 3-week wean off Zoloft.  Follow-up if needed  Lymphocytic colitis: Resolved.  Monitoring for recurrence.  Weight gain: Could be related to SSRI or steroids.  Patient to start exercise and healthy eating.  Follow up: November for complete physical Visit date not found  No orders of the defined types were placed in this encounter.  No orders of the defined types were placed in this encounter.     I reviewed the patients updated PMH, FH, and SocHx.    Patient Active Problem List   Diagnosis Date Noted  . Lymphocytic colitis 08/27/2020  . Oral contraceptive use 08/27/2020  . Perimenopausal 08/27/2020  . Palpitations 06/15/2017  . Inflammatory arthritis 05/19/2017  . 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 Asthma 07/27/2011  . Irritable bowel syndrome 07/27/2011  . SVT (supraventricular tachycardia) (HCC) 07/27/2011   Current Meds  Medication Sig  . meloxicam (MOBIC) 15 MG tablet Take 1 tablet (15 mg total) by mouth daily.  . sertraline (ZOLOFT) 50 MG tablet Take 1 tablet (50 mg total) by mouth  daily.  . [DISCONTINUED] budesonide (ENTOCORT EC) 3 MG 24 hr capsule TAKE 3 CAPSULES BY MOUTH DAILY    Allergies: Patient is allergic to sulfa antibiotics. Family History: Patient family history includes Alcoholism in her maternal grandfather; Asthma in her daughter; Diabetes in her father and paternal grandfather; Healthy in her daughter, daughter, and son; Heart disease in her maternal grandfather. Social History:  Patient  reports that she has never smoked. She has never used smokeless tobacco. She reports that she does not drink alcohol and does not use drugs.  Review of Systems: Constitutional: Negative for fever malaise or anorexia Cardiovascular: negative for chest pain Respiratory: negative for SOB or persistent cough Gastrointestinal: negative for abdominal pain  Objective  Vitals: BP 130/82   Pulse 79   Temp 98.2 F (36.8 C) (Temporal)   Ht 5\' 2"  (1.575 m)   Wt 130 lb 6.4 oz (59.1 kg)   SpO2 98%   BMI 23.85 kg/m  General: no acute distress , A&Ox3  Wt Readings from Last 3 Encounters:  01/14/21 130 lb 6.4 oz (59.1 kg)  08/27/20 124 lb 12.8 oz (56.6 kg)  08/18/20 129 lb (58.5 kg)     Commons side effects, risks, benefits, and alternatives for medications and treatment plan prescribed today were discussed, and the patient expressed understanding of the given instructions. Patient is instructed to call or message via MyChart if he/she has any questions  or concerns regarding our treatment plan. No barriers to understanding were identified. We discussed Red Flag symptoms and signs in detail. Patient expressed understanding regarding what to do in case of urgent or emergency type symptoms.   Medication list was reconciled, printed and provided to the patient in AVS. Patient instructions and summary information was reviewed with the patient as documented in the AVS. This note was prepared with assistance of Dragon voice recognition software. Occasional wrong-word or  sound-a-like substitutions may have occurred due to the inherent limitations of voice recognition software  This visit occurred during the SARS-CoV-2 public health emergency.  Safety protocols were in place, including screening questions prior to the visit, additional usage of staff PPE, and extensive cleaning of exam room while observing appropriate contact time as indicated for disinfecting solutions.

## 2021-01-14 NOTE — Patient Instructions (Signed)
Please return in November 2022 for your annual complete physical; please come fasting.  If you have any questions or concerns, please don't hesitate to send me a message via MyChart or call the office at [phone]. Thank you for visiting with [place] today! It's our pleasure caring for you.  Wt Readings from Last 3 Encounters:  01/14/21 130 lb 6.4 oz (59.1 kg)  08/27/20 124 lb 12.8 oz (56.6 kg)  08/18/20 129 lb (58.5 kg)   Wean off the sertraline as follows: Take 1/2 tab (25mg ) daily for one week, Then take 1/2 tab every other day for one week, Then take 1/2 tab on Mon and Friday, Then stop.

## 2021-04-06 MED ORDER — BUDESONIDE 3 MG PO CPEP: 9.0000 mg | ORAL_CAPSULE | Freq: Every day | ORAL | 1 refills | Status: DC

## 2021-05-27 NOTE — Telephone Encounter (Signed)
Please refill with same dose and instructions as last Rx  - HD

## 2021-05-27 NOTE — Telephone Encounter (Signed)
Dr. Myrtie Neither, please advise on Hyoscyamine RX. Thanks

## 2021-05-28 ENCOUNTER — Other Ambulatory Visit: Payer: Self-pay

## 2021-05-28 MED ORDER — HYOSCYAMINE SULFATE 0.125 MG SL SUBL: 0.1250 mg | SUBLINGUAL_TABLET | Freq: Four times a day (QID) | SUBLINGUAL | 2 refills | Status: DC | PRN

## 2021-07-18 ENCOUNTER — Other Ambulatory Visit: Payer: Self-pay | Admitting: Gastroenterology

## 2021-09-29 ENCOUNTER — Encounter: Payer: Self-pay | Admitting: Gastroenterology

## 2021-09-29 ENCOUNTER — Ambulatory Visit: Payer: 59 | Admitting: Gastroenterology

## 2021-09-29 VITALS — BP 120/84 | HR 88 | Ht 62.0 in | Wt 129.1 lb

## 2021-09-29 DIAGNOSIS — K580 Irritable bowel syndrome with diarrhea: Secondary | ICD-10-CM | POA: Diagnosis not present

## 2021-09-29 DIAGNOSIS — K52832 Lymphocytic colitis: Secondary | ICD-10-CM

## 2021-09-29 NOTE — Progress Notes (Signed)
° ° ° °  [facility] Progress Note  Chief Complaint: IBS and microscopic colitis  Subjective  History: Kelsey Johnson follows up for diarrhea and established microscopic colitis along with IBS.  She had a flare of watery diarrhea over the summer and required about 2 months of budesonide for control, finally tapering down to 1 tablet a day in late September.  She then went back on hyoscyamine for symptoms of tenesmus and loose stool.  She is doing generally well, and takes 1 Levsin every morning and sometimes another in the afternoon if needed.  Has noticed some food triggers.  Denies rectal bleeding, appetite good and weight stable. ROS: Cardiovascular:  no chest pain Respiratory: no dyspnea  The patient's Past Medical, Family and Social History were reviewed and are on file in the EMR.  Objective:  Med list reviewed  Current Outpatient Medications:    hyoscyamine (LEVSIN SL) 0.125 MG SL tablet, DISSOLVE 1 TABLET(0.125 MG) UNDER THE TONGUE EVERY 6 HOURS AS NEEDED, Disp: 30 tablet, Rfl: 2   meloxicam (MOBIC) 15 MG tablet, Take 1 tablet (15 mg total) by mouth daily. (Patient taking differently: Take 15 mg by mouth as needed.), Disp: 30 tablet, Rfl: 0   sertraline (ZOLOFT) 50 MG tablet, Take 1 tablet (50 mg total) by mouth daily., Disp: 90 tablet, Rfl: 3   Vital signs in last 24 hrs: Vitals:   09/29/21 1404  BP: 120/84  Pulse: 88   Wt Readings from Last 3 Encounters:  09/29/21 129 lb 2 oz (58.6 kg)  01/14/21 130 lb 6.4 oz (59.1 kg)  08/27/20 124 lb 12.8 oz (56.6 kg)    Physical Exam  Well-appearing HEENT: sclera anicteric, oral mucosa moist without lesions Neck: supple, no thyromegaly, JVD or lymphadenopathy Cardiac: RRR without murmurs, S1S2 heard, no peripheral edema Pulm: clear to auscultation bilaterally, normal RR and effort noted Abdomen: soft, no tenderness, with active bowel sounds. No guarding or palpable  hepatosplenomegaly.  Labs:   ___________________________________________ Radiologic studies:   ____________________________________________ Other:   _____________________________________________ Assessment & Plan  Assessment: Encounter Diagnoses  Name Primary?   Lymphocytic colitis Yes   Irritable bowel syndrome with diarrhea     Lymphocytic colitis with periodic flare and more chronic symptoms from underlying IBS.  No prior SIBO testing or treatment, though no risk factors for this, and does not have malabsorption.  Plan: Continue current management, some written dietary advice given, see me in a year or sooner as needed.  22 minutes were spent on this encounter (including chart review, history/exam, counseling/coordination of care, and documentation) > 50% of that time was spent on counseling and coordination of care.   Charlie Pitter III

## 2021-09-29 NOTE — Patient Instructions (Signed)
We have sent the following medications to your pharmacy for you to pick up at your convenience:  Levsin  

## 2021-10-06 ENCOUNTER — Other Ambulatory Visit: Payer: Self-pay | Admitting: Gastroenterology

## 2021-10-06 ENCOUNTER — Other Ambulatory Visit: Payer: Self-pay | Admitting: Family Medicine

## 2021-10-07 ENCOUNTER — Encounter: Payer: Self-pay | Admitting: Gastroenterology

## 2021-10-07 ENCOUNTER — Other Ambulatory Visit: Payer: Self-pay

## 2021-10-07 MED ORDER — HYOSCYAMINE SULFATE 0.125 MG SL SUBL: SUBLINGUAL_TABLET | SUBLINGUAL | 4 refills | Status: DC

## 2021-10-08 ENCOUNTER — Encounter: Payer: Self-pay | Admitting: Gastroenterology

## 2021-10-09 MED ORDER — HYOSCYAMINE SULFATE 0.125 MG SL SUBL: 0.1250 mg | SUBLINGUAL_TABLET | Freq: Four times a day (QID) | SUBLINGUAL | 0 refills | Status: DC | PRN

## 2021-10-12 MED ORDER — BUDESONIDE 3 MG PO CPEP: ORAL_CAPSULE | ORAL | 0 refills | Status: DC

## 2021-10-12 NOTE — Addendum Note (Signed)
Addended by: Missy Sabins on: 10/12/2021 09:25 AM   Modules accepted: Orders

## 2021-10-12 NOTE — Addendum Note (Signed)
Addended by: Missy Sabins on: 10/12/2021 12:40 PM   Modules accepted: Orders

## 2021-10-12 NOTE — Telephone Encounter (Signed)
Short trial of budesonide to see if this helps the urgency and diarrhea.  Budesonide 6 mg (two of the 3 mg tablets) once daily for 4 weeks, then 3 mg once daily for 2 weeks, then stop. Disp # 120, RF zero  - HD

## 2021-11-08 ENCOUNTER — Encounter: Payer: Self-pay | Admitting: Family Medicine

## 2021-11-09 MED ORDER — SERTRALINE HCL 50 MG PO TABS: 50.0000 mg | ORAL_TABLET | Freq: Every day | ORAL | 0 refills | Status: DC

## 2021-11-09 NOTE — Telephone Encounter (Signed)
Please advise 

## 2021-12-08 ENCOUNTER — Encounter: Payer: Self-pay | Admitting: Gastroenterology

## 2021-12-18 ENCOUNTER — Other Ambulatory Visit: Payer: Self-pay | Admitting: Gastroenterology

## 2022-01-06 ENCOUNTER — Encounter: Payer: Self-pay | Admitting: Family Medicine

## 2022-01-06 ENCOUNTER — Ambulatory Visit: Payer: 59 | Admitting: Family Medicine

## 2022-01-06 VITALS — BP 133/85 | HR 76 | Temp 98.6°F | Ht 62.0 in | Wt 127.2 lb

## 2022-01-06 DIAGNOSIS — F430 Acute stress reaction: Secondary | ICD-10-CM

## 2022-01-06 MED ORDER — SERTRALINE HCL 25 MG PO TABS: 25.0000 mg | ORAL_TABLET | Freq: Every day | ORAL | 0 refills | Status: DC

## 2022-01-06 NOTE — Patient Instructions (Signed)
Please follow up as scheduled for your next visit with me: 04/19/2022  ? ?Wean the zoloft as follows: ?Take 25mg  daily for one week, ?Then take 25mg  4x/ week for one week, ?Then take 25mg  M and Th, then stop. ? ? ?If you have any questions or concerns, please don't hesitate to send me a message via MyChart or call the office at [phone]. Thank you for visiting with today! It's our pleasure caring for you.  ?

## 2022-01-06 NOTE — Progress Notes (Signed)
? ?Subjective  ?CC:  ?Chief Complaint  ?Patient presents with  ? Anxiety  ? ? ?HPI: Kelsey Johnson is a 51 y.o. female who presents to the office today to address the problems listed above in the chief complaint, mood problems. ?51 year old here to follow-up on stress reaction.  We started Zoloft a few years back because she was having lots of abdominal/GI symptoms and stress related to this.  She subsequently was diagnosed with microscopic colitis and is now on steroids.  This is now well controlled.  She has no mood or anxiety symptoms.  She is not sure whether she should stay on the Zoloft or not.  No history of mood disorder.  ? ?  01/06/2022  ?  4:33 PM 08/27/2020  ? 11:06 AM 12/12/2019  ?  3:40 PM  ?Depression screen PHQ 2/9  ?Decreased Interest 0 0 0  ?Down, Depressed, Hopeless 0 0 0  ?PHQ - 2 Score 0 0 0  ?Altered sleeping 0    ?Tired, decreased energy 0    ?Change in appetite 0    ?Feeling bad or failure about yourself  0    ?Trouble concentrating 0    ?Moving slowly or fidgety/restless 0    ?Suicidal thoughts 0    ?PHQ-9 Score 0    ?Difficult doing work/chores Not difficult at all    ? ? ?  01/06/2022  ?  4:49 PM  ?GAD 7 : Generalized Anxiety Score  ?Nervous, Anxious, on Edge 0  ?Control/stop worrying 0  ?Worry too much - different things 0  ?Trouble relaxing 0  ?Restless 0  ?Easily annoyed or irritable 0  ?Afraid - awful might happen 0  ?Total GAD 7 Score 0  ?Anxiety Difficulty Not difficult at all  ? ? ? ? ? ? ?Assessment  ?1. Stress reaction   ? ?  ?Plan  ?Stress reaction: No longer having symptoms and GI problems have been resolved.  Recommend weaning.  See after visit summary for weaning instructions.  Patient to monitor mood and we will follow-up in July to see how she is doing ?Reviewed concept of mood problems caused by biochemical imbalance of neurotransmitters and rationale for treatment with medications and therapy.  ?Counseling given: pt was instructed to contact office, on-call physician or crisis  Hotline if symptoms worsen significantly. If patient develops any suicidal or homicidal thoughts, she is directed to the ER immediately.  ? ?Follow up: Complete physical ?No orders of the defined types were placed in this encounter. ? ?Meds ordered this encounter  ?Medications  ? sertraline (ZOLOFT) 25 MG tablet  ?  Sig: Take 1 tablet (25 mg total) by mouth daily.  ?  Dispense:  90 tablet  ?  Refill:  0  ? ?  ? ?I reviewed the patients updated PMH, FH, and SocHx.  ?  ?Patient Active Problem List  ? Diagnosis Date Noted  ? Lymphocytic colitis 08/27/2020  ? Oral contraceptive use 08/27/2020  ? Perimenopausal 08/27/2020  ? Palpitations 06/15/2017  ? Inflammatory arthritis 05/19/2017  ? 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 Asthma 07/27/2011  ? Irritable bowel syndrome 07/27/2011  ? Paroxysmal SVT (supraventricular tachycardia) (HCC) 07/27/2011  ? ?Current Meds  ?Medication Sig  ? budesonide (ENTOCORT EC) 3 MG 24 hr capsule Take 2 capsules (6 mg total) by mouth daily.  ? hyoscyamine (LEVSIN SL) 0.125 MG SL tablet Place 1 tablet (0.125 mg total) under the tongue every 6 (six) hours as needed.  ? sertraline (  ZOLOFT) 25 MG tablet Take 1 tablet (25 mg total) by mouth daily.  ? [DISCONTINUED] sertraline (ZOLOFT) 50 MG tablet Take 1 tablet (50 mg total) by mouth daily.  ? ? ?Allergies: ?Patient is allergic to sulfa antibiotics. ?Family history:  ?Patient family history includes Alcoholism in her maternal grandfather; Asthma in her daughter; Diabetes in her father and paternal grandfather; Healthy in her daughter, daughter, and son; Heart disease in her maternal grandfather. ?Social History  ? ?Socioeconomic History  ? Marital status: Married  ?  Spouse name: Not on file  ? Number of children: 4  ? Years of education: Not on file  ? Highest education level: Not on file  ?Occupational History  ? Occupation: Runner, broadcasting/film/video- [place]  ?  Employer: [employer]  ?Tobacco Use  ? Smoking status: Never  ? Smokeless  tobacco: Never  ?Vaping Use  ? Vaping Use: Never used  ?Substance and Sexual Activity  ? Alcohol use: No  ? Drug use: No  ? Sexual activity: Yes  ?  Birth control/protection: Pill  ?  Comment: [employer]  ?Other Topics Concern  ? Not on file  ?Social History Narrative  ? Not on file  ? ?Social Determinants of Health  ? ?Financial Resource Strain: Not on file  ?Food Insecurity: Not on file  ?Transportation Needs: Not on file  ?Physical Activity: Not on file  ?Stress: Not on file  ?Social Connections: Not on file  ? ? ? ?Review of Systems: ?Constitutional: Negative for fever malaise or anorexia ?Cardiovascular: negative for chest pain ?Respiratory: negative for SOB or persistent cough ?Gastrointestinal: negative for abdominal pain ? ?Objective  ?Vitals: BP 133/85   Pulse 76   Temp 98.6 ?F (37 ?C) (Temporal)   Ht 5\' 2"  (1.575 m)   Wt 127 lb 3.2 oz (57.7 kg)   SpO2 95%   BMI 23.27 kg/m?  ?General: no acute distress, well appearing, no apparent distress, well groomed ?Psych:  Alert and oriented x 3,normal mood, behavior, speech, dress, and thought processes.  ? ? ?Commons side effects, risks, benefits, and alternatives for medications and treatment plan prescribed today were discussed, and the patient expressed understanding of the given instructions. Patient is instructed to call or message via MyChart if he/she has any questions or concerns regarding our treatment plan. No barriers to understanding were identified. We discussed Red Flag symptoms and signs in detail. Patient expressed understanding regarding what to do in case of urgent or emergency type symptoms.  ?Medication list was reconciled, printed and provided to the patient in AVS. Patient instructions and summary information was reviewed with the patient as documented in the AVS. ?This note was prepared with assistance of . Occasional wrong-word or sound-a-like substitutions may have occurred due to the inherent limitations  of voice recognition software ? ? ? ? ?

## 2022-01-14 ENCOUNTER — Telehealth: Payer: Self-pay

## 2022-01-14 NOTE — Telephone Encounter (Signed)
Pt is requesting TOC to Dr. Lawerance Bach. Pt mother is a pt of Dr. Lawerance Bach and she has agreed to see the pt. However I advised pt that [place] protocol was to ask both providers. Upon the approval I would give her a call back to get her scheduled. ? ?Please advise ?

## 2022-02-04 ENCOUNTER — Encounter: Payer: Self-pay | Admitting: Family Medicine

## 2022-02-08 ENCOUNTER — Encounter: Payer: Self-pay | Admitting: Gastroenterology

## 2022-03-08 ENCOUNTER — Encounter: Payer: Self-pay | Admitting: Family Medicine

## 2022-03-09 MED ORDER — SERTRALINE HCL 50 MG PO TABS: ORAL_TABLET | ORAL | 3 refills | Status: DC

## 2022-03-19 ENCOUNTER — Other Ambulatory Visit: Payer: Self-pay | Admitting: Family Medicine

## 2022-04-19 ENCOUNTER — Encounter: Payer: Self-pay | Admitting: Family Medicine

## 2022-04-19 ENCOUNTER — Ambulatory Visit (INDEPENDENT_AMBULATORY_CARE_PROVIDER_SITE_OTHER): Payer: 59 | Admitting: Family Medicine

## 2022-04-19 VITALS — BP 122/70 | HR 79 | Temp 97.7°F | Ht 62.0 in | Wt 129.8 lb

## 2022-04-19 DIAGNOSIS — Z1159 Encounter for screening for other viral diseases: Secondary | ICD-10-CM

## 2022-04-19 DIAGNOSIS — K52832 Lymphocytic colitis: Secondary | ICD-10-CM

## 2022-04-19 DIAGNOSIS — K580 Irritable bowel syndrome with diarrhea: Secondary | ICD-10-CM

## 2022-04-19 DIAGNOSIS — Z23 Encounter for immunization: Secondary | ICD-10-CM | POA: Diagnosis not present

## 2022-04-19 DIAGNOSIS — Z0000 Encounter for general adult medical examination without abnormal findings: Secondary | ICD-10-CM

## 2022-04-19 DIAGNOSIS — I471 Supraventricular tachycardia: Secondary | ICD-10-CM

## 2022-04-19 LAB — CBC WITH DIFFERENTIAL/PLATELET
Basophils Absolute: 0 10*3/uL (ref 0.0–0.1)
Basophils Relative: 0.1 % (ref 0.0–3.0)
Eosinophils Absolute: 0 10*3/uL (ref 0.0–0.7)
Eosinophils Relative: 0.4 % (ref 0.0–5.0)
HCT: 40.2 % (ref 36.0–46.0)
Hemoglobin: 13.1 g/dL (ref 12.0–15.0)
Lymphocytes Relative: 10.9 % — ABNORMAL LOW (ref 12.0–46.0)
Lymphs Abs: 1.3 10*3/uL (ref 0.7–4.0)
MCHC: 32.7 g/dL (ref 30.0–36.0)
MCV: 92 fl (ref 78.0–100.0)
Monocytes Absolute: 0.5 10*3/uL (ref 0.1–1.0)
Monocytes Relative: 4.1 % (ref 3.0–12.0)
Neutro Abs: 9.7 10*3/uL — ABNORMAL HIGH (ref 1.4–7.7)
Neutrophils Relative %: 84.5 % — ABNORMAL HIGH (ref 43.0–77.0)
Platelets: 373 10*3/uL (ref 150.0–400.0)
RBC: 4.37 Mil/uL (ref 3.87–5.11)
RDW: 12.9 % (ref 11.5–15.5)
WBC: 11.5 10*3/uL — ABNORMAL HIGH (ref 4.0–10.5)

## 2022-04-19 LAB — COMPREHENSIVE METABOLIC PANEL
ALT: 25 U/L (ref 0–35)
AST: 21 U/L (ref 0–37)
Albumin: 4.3 g/dL (ref 3.5–5.2)
Alkaline Phosphatase: 63 U/L (ref 39–117)
BUN: 12 mg/dL (ref 6–23)
CO2: 30 mEq/L (ref 19–32)
Calcium: 9.5 mg/dL (ref 8.4–10.5)
Chloride: 101 mEq/L (ref 96–112)
Creatinine, Ser: 0.82 mg/dL (ref 0.40–1.20)
GFR: 83 mL/min (ref >60.00)
Glucose, Bld: 116 mg/dL — ABNORMAL HIGH (ref 70–99)
Potassium: 4.3 mEq/L (ref 3.5–5.1)
Sodium: 141 mEq/L (ref 135–145)
Total Bilirubin: 0.4 mg/dL (ref 0.2–1.2)
Total Protein: 7.2 g/dL (ref 6.0–8.3)

## 2022-04-19 LAB — LIPID PANEL
Cholesterol: 177 mg/dL (ref 0–200)
HDL: 41.8 mg/dL (ref >39.00)
LDL Cholesterol: 103 mg/dL — ABNORMAL HIGH (ref 0–99)
NonHDL: 134.87
Total CHOL/HDL Ratio: 4
Triglycerides: 157 mg/dL — ABNORMAL HIGH (ref 0.0–149.0)
VLDL: 31.4 mg/dL (ref 0.0–40.0)

## 2022-04-19 MED ORDER — SERTRALINE HCL 50 MG PO TABS
50.0000 mg | ORAL_TABLET | Freq: Every day | ORAL | 3 refills | Status: DC
Start: 2022-04-19 — End: 2023-05-05

## 2022-04-19 NOTE — Patient Instructions (Signed)
Please return in 12 months for your annual complete physical; please come fasting. Schedule a nurse visit in 6 months to receive your 2nd shingrix vaccination.   Today you had your 1st shingrix and Tdap vaccines.   I will release your lab results to you on your MyChart account with further instructions. You may see the results before I do, but when I review them I will send you a message with my report or have my assistant call you if things need to be discussed. Please reply to my message with any questions. Thank you!   If you have any questions or concerns, please don't hesitate to send me a message via MyChart or call the office at [phone]. Thank you for visiting with [place] today! It's our pleasure caring for you.

## 2022-04-19 NOTE — Progress Notes (Unsigned)
Subjective  Chief Complaint  Patient presents with   Annual Exam    Pt here for annual exam and is not currently fasting     HPI: Kelsey Johnson is a 50 y.o. female who presents to [facility] today for a Female Wellness Visit. She also has the concerns and/or needs as listed above in the chief complaint. These will be addressed in addition to the Health Maintenance Visit.   Wellness Visit: annual visit with health maintenance review and exam without Pap  HM: sees GYN in august. On OCPs. Will have mammo them. Pap up to date. CRC screen current. Doing well overall. Enjoying her summer break. Imms: eligible for shingrix and tdap today.  Chronic disease f/u and/or acute problem visit: (deemed necessary to be done in addition to the wellness visit): IBS and colitis: remains on steroids. Doing well overall but was having more crampy diarrhea: restarted sertraline and sxs now improved again.  No PSVT sxs.   Assessment  1. Annual physical exam   2. Need for Tdap vaccination   3. Need for shingles vaccine   4. Need for hepatitis C screening test   5. Irritable bowel syndrome with diarrhea   6. Paroxysmal SVT (supraventricular tachycardia) (HCC)   7. Lymphocytic colitis      Plan  Female Wellness Visit: Age appropriate Health Maintenance and Prevention measures were discussed with patient. Included topics are cancer screening recommendations, ways to keep healthy (see AVS) including dietary and exercise recommendations, regular eye and dental care, use of seat belts, and avoidance of moderate alcohol use and tobacco use.  BMI: discussed patient's BMI and encouraged positive lifestyle modifications to help get to or maintain a target BMI. HM needs and immunizations were addressed and ordered. See below for orders. See HM and immunization section for updates. Shingrix and tdap today Routine labs and screening tests ordered including cmp, cbc and lipids where  appropriate. Discussed recommendations regarding Vit D and calcium supplementation (see AVS)  Chronic disease management visit and/or acute problem visit: IBS/colitis. Continue budensonide and sertraline 57m daily. Well controlled. Following along with GI for colitis.  PSVT rare sxs  Follow up: 12 mo for cpe  Orders Placed This Encounter  Procedures   Tdap vaccine greater than or equal to 7yo IM   Varicella-zoster vaccine IM (Shingrix)   CBC with Differential/Platelet   Comp Met (CMET)   Lipid Profile   Hepatitis C Antibody   No orders of the defined types were placed in this encounter.     Body mass index is 23.74 kg/m. Wt Readings from Last 3 Encounters:  04/19/22 129 lb 12.8 oz (58.9 kg)  01/06/22 127 lb 3.2 oz (57.7 kg)  09/29/21 129 lb 2 oz (58.6 kg)     Patient Active Problem List   Diagnosis Date Noted   Lymphocytic colitis 08/27/2020   Oral contraceptive use 08/27/2020   Perimenopausal 08/27/2020   Palpitations 06/15/2017   Inflammatory arthritis 05/19/2017    [facility]. 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 Controlled on Celebrex. 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   Asthma 07/27/2011   Irritable bowel syndrome 07/27/2011   Paroxysmal SVT ([place]ricular tachycardia) [place] 07/27/2011   Health Maintenance  Topic Date Due   Hepatitis C Screening  Never done   Zoster Vaccines- Shingrix (1 of 2) Never done   TETANUS/TDAP  07/27/2021   INFLUENZA VACCINE  05/04/2022   MAMMOGRAM  05/04/2022  PAP SMEAR-Modifier  06/28/2023   COLONOSCOPY (Pts 45-71yr Insurance coverage will need to be confirmed)  04/28/2030   HIV Screening  Completed   HPV VACCINES  Aged Out   COVID-19 Vaccine  Discontinued   Immunization History  Administered Date(s) Administered   Influenza,inj,Quad PF,6+ Mos 07/13/2018, 07/24/2019, 08/27/2020   PPD Test 04/26/2017   Tdap 07/28/2011   We updated and reviewed the patient's past history  in detail and it is documented below. Allergies: Patient is allergic to sulfa antibiotics. Past Medical History Patient  has a past medical history of Anxiety, Arthritis, Asthma, CTIN (chronic tubulo-interstitial nephritis), IBS (irritable bowel syndrome), Kleine-Levin syndrome (2002), Microscopic colitis, and Supraventricular tachycardia ([place] (2011). Past Surgical History Patient  has a past surgical history that includes Tympanostomy tube placement and Adenoidectomy. Family History: Patient family history includes Alcoholism in her maternal grandfather; Asthma in her daughter; Diabetes in her father and paternal grandfather; Healthy in her daughter, daughter, and son; Heart disease in her maternal grandfather. Social History:  Patient  reports that she has never smoked. She has never used smokeless tobacco. She reports that she does not drink alcohol and does not use drugs.  Review of Systems: Constitutional: negative for fever or malaise Ophthalmic: negative for photophobia, double vision or loss of vision Cardiovascular: negative for chest pain, dyspnea on exertion, or new LE swelling Respiratory: negative for SOB or persistent cough Gastrointestinal: negative for abdominal pain, change in bowel habits or melena Genitourinary: negative for dysuria or gross hematuria, no abnormal uterine bleeding or disharge Musculoskeletal: negative for new gait disturbance or muscular weakness Integumentary: negative for new or persistent rashes, no breast lumps Neurological: negative for TIA or stroke symptoms Psychiatric: negative for SI or delusions Allergic/Immunologic: negative for hives  Patient Care Team    Relationship Specialty Notifications Start End  ALeamon Arnt MD PCP - General Family Medicine  12/12/19   LDonnel Saxon CLake ArborMidwife Obstetrics and Gynecology  05/14/19   DDoran Stabler MD Consulting Physician Gastroenterology  12/12/19   Nahser, PWonda Cheng MD Consulting Physician  Cardiology  12/12/19     Objective  Vitals: BP 122/70   Pulse 79   Temp 97.7 F (36.5 C)   Ht '5\' 2"'  (1.575 m)   Wt 129 lb 12.8 oz (58.9 kg)   SpO2 98%   BMI 23.74 kg/m  General:  Well developed, well nourished, no acute distress  Psych:  Alert and orientedx3,normal mood and affect HEENT:  Normocephalic, atraumatic, non-icteric sclera,  supple neck without adenopathy, mass or thyromegaly Cardiovascular:  Normal S1, S2, RRR without gallop, rub or murmur Respiratory:  Good breath sounds bilaterally, CTAB with normal respiratory effort Gastrointestinal: normal bowel sounds, soft, non-tender, no noted masses. No HSM MSK: no deformities, contusions. Joints are without erythema or swelling.  Skin:  Warm, no rashes or suspicious lesions noted Neurologic:    Mental status is normal. CN 2-11 are normal. Gross motor and sensory exams are normal. Normal gait. No tremor   Commons side effects, risks, benefits, and alternatives for medications and treatment plan prescribed today were discussed, and the patient expressed understanding of the given instructions. Patient is instructed to call or message via MyChart if he/she has any questions or concerns regarding our treatment plan. No barriers to understanding were identified. We discussed Red Flag symptoms and signs in detail. Patient expressed understanding regarding what to do in case of urgent or emergency type symptoms.  Medication list was reconciled, printed and provided to the  patient in AVS. Patient instructions and summary information was reviewed with the patient as documented in the AVS. This note was prepared with assistance of Dragon voice recognition software. Occasional wrong-word or sound-a-like substitutions may have occurred due to the inherent limitations of voice recognition software  This visit occurred during the SARS-CoV-2 public health emergency.  Safety protocols were in place, including screening questions prior to the visit,  additional usage of staff PPE, and extensive cleaning of exam room while observing appropriate contact time as indicated for disinfecting solutions.

## 2022-04-20 LAB — HEPATITIS C ANTIBODY: Hepatitis C Ab: NONREACTIVE

## 2022-05-04 LAB — HM PAP SMEAR: HM Pap smear: NORMAL

## 2022-05-12 ENCOUNTER — Encounter (INDEPENDENT_AMBULATORY_CARE_PROVIDER_SITE_OTHER): Payer: Self-pay

## 2022-06-05 ENCOUNTER — Encounter: Payer: Self-pay | Admitting: Gastroenterology

## 2022-06-08 ENCOUNTER — Other Ambulatory Visit: Payer: Self-pay

## 2022-06-08 MED ORDER — HYOSCYAMINE SULFATE 0.125 MG SL SUBL: 0.1250 mg | SUBLINGUAL_TABLET | Freq: Four times a day (QID) | SUBLINGUAL | 0 refills | Status: DC | PRN

## 2022-06-08 MED ORDER — BUDESONIDE 3 MG PO CPEP: 6.0000 mg | ORAL_CAPSULE | Freq: Every day | ORAL | 1 refills | Status: DC

## 2022-06-11 ENCOUNTER — Other Ambulatory Visit: Payer: Self-pay

## 2022-06-11 MED ORDER — HYOSCYAMINE SULFATE 0.125 MG SL SUBL: 0.1250 mg | SUBLINGUAL_TABLET | Freq: Four times a day (QID) | SUBLINGUAL | 0 refills | Status: DC | PRN

## 2022-06-28 ENCOUNTER — Encounter: Payer: Self-pay | Admitting: *Deleted

## 2022-08-24 ENCOUNTER — Other Ambulatory Visit: Payer: Self-pay | Admitting: Gastroenterology

## 2022-09-16 ENCOUNTER — Encounter: Payer: Self-pay | Admitting: *Deleted

## 2022-09-23 ENCOUNTER — Ambulatory Visit: Payer: 59

## 2022-09-29 ENCOUNTER — Ambulatory Visit (INDEPENDENT_AMBULATORY_CARE_PROVIDER_SITE_OTHER): Payer: 59

## 2022-09-29 DIAGNOSIS — Z23 Encounter for immunization: Secondary | ICD-10-CM | POA: Diagnosis not present

## 2022-11-25 ENCOUNTER — Other Ambulatory Visit: Payer: Self-pay | Admitting: Gastroenterology

## 2023-01-19 ENCOUNTER — Encounter: Payer: Self-pay | Admitting: Gastroenterology

## 2023-01-19 ENCOUNTER — Ambulatory Visit: Payer: 59 | Admitting: Gastroenterology

## 2023-01-19 VITALS — BP 124/80 | HR 79 | Ht 62.0 in | Wt 133.0 lb

## 2023-01-19 DIAGNOSIS — K52832 Lymphocytic colitis: Secondary | ICD-10-CM

## 2023-01-19 DIAGNOSIS — K580 Irritable bowel syndrome with diarrhea: Secondary | ICD-10-CM

## 2023-01-19 NOTE — Progress Notes (Deleted)
     [facility] Progress Note  Chief Complaint: Microscopic colitis and IBS  Subjective  History: Khaleesi was last seen in clinic follow-up December 2022 for her lymphocytic colitis and IBS.  She has periodically taper down or off budesonide, and sometimes needed courses of if diarrhea recurs.  She has also contacted [place] in the meantime through portal messages for advice on medication management, and she continues to take hyoscyamine that we have also refilled.  ***  ROS: Cardiovascular:  no chest pain Respiratory: no dyspnea  The patient's Past Medical, Family and Social History were reviewed and are on file in the EMR.  Objective:  Med list reviewed  Current Outpatient Medications:    BLISOVI FE 1/20 1-20 MG-MCG tablet, Take by mouth., Disp: , Rfl:    budesonide (ENTOCORT EC) 3 MG 24 hr capsule, TAKE 2 CAPSULES DAILY, Disp: 180 capsule, Rfl: 0   hyoscyamine (LEVSIN SL) 0.125 MG SL tablet, DISSOLVE 1 TABLET UNDER THE TONGUE EVERY 6 HOURS AS NEEDED, Disp: 360 tablet, Rfl: 0   sertraline (ZOLOFT) 50 MG tablet, Take 1 tablet (50 mg total) by mouth daily., Disp: 90 tablet, Rfl: 3   Vital signs in last 24 hrs: There were no vitals filed for this visit. Wt Readings from Last 3 Encounters:  04/19/22 129 lb 12.8 oz (58.9 kg)  01/06/22 127 lb 3.2 oz (57.7 kg)  09/29/21 129 lb 2 oz (58.6 kg)    Physical Exam  *** HEENT: sclera anicteric, oral mucosa moist without lesions Neck: supple, no thyromegaly, JVD or lymphadenopathy Cardiac: ***,  no peripheral edema Pulm: clear to auscultation bilaterally, normal RR and effort noted Abdomen: soft, *** tenderness, with active bowel sounds. No guarding or palpable hepatosplenomegaly. Skin; warm and dry, no jaundice or rash  Labs:   ___________________________________________ Radiologic studies:   ____________________________________________ Other:   _____________________________________________ Assessment & Plan   Assessment: No diagnosis found.    Plan:   *** minutes were spent on this encounter (including chart review, history/exam, counseling/coordination of care, and documentation) > 50% of that time was spent on counseling and coordination of care.   Charlie Pitter III

## 2023-01-19 NOTE — Progress Notes (Signed)
[facility] Progress Note  Chief Complaint: Microscopic colitis and IBS  Subjective  Chief Complaint  Patient presents with   LYMPHOCYTIC COLITIS    on budesonide 6 mg qd - manages her Sx - no complaints, denies diarrhea   History: Harris was last seen in clinic follow-up December 2022 for her lymphocytic colitis and IBS.  She has periodically taper down or off budesonide, and sometimes needed courses of if diarrhea recurs.  She has also contacted [place] in the meantime through portal messages for advice on medication management, and she continues to take hyoscyamine that we have also refilled.  Today, she reports feeling well overall. She reports compliance and good tolerance of budesonide 3 mg 2x daily. She states she has not tried to reduce the dose recently.   She states that she tried to reduce her Zoloft dose of 50 mg daily last summer but was unsuccessful as she had IBS symptoms. She reports occasional use of NSAID'S    She states that she would take hyoscyamine occasionally.   She denies diarrhea, constipation, nausea, blood in stool, black stool, vomiting, abdominal pain, bloating, unintentional weight loss, reflux, dysphagia.  She reports gaining some weight and is trying to lose it with a calorie tracking diet.   ROS: Review of Systems  Constitutional:  Negative for appetite change and fever.  HENT:  Negative for trouble swallowing.   Respiratory:  Negative for cough and shortness of breath.   Cardiovascular:  Negative for chest pain.  Gastrointestinal:  Negative for abdominal distention, abdominal pain, anal bleeding, blood in stool, constipation, diarrhea, nausea, rectal pain and vomiting.  Genitourinary:  Negative for dysuria.  Musculoskeletal:  Negative for back pain.  Skin:  Negative for rash.  Neurological:  Negative for weakness.  All other systems reviewed and are negative.  The patient's Past Medical, Family and Social History were reviewed and are on  file in the EMR.  Objective:  Med list reviewed  Current Outpatient Medications:    BLISOVI FE 1/20 1-20 MG-MCG tablet, Take by mouth., Disp: , Rfl:    budesonide (ENTOCORT EC) 3 MG 24 hr capsule, TAKE 2 CAPSULES DAILY, Disp: 180 capsule, Rfl: 0   hyoscyamine (LEVSIN SL) 0.125 MG SL tablet, DISSOLVE 1 TABLET UNDER THE TONGUE EVERY 6 HOURS AS NEEDED, Disp: 360 tablet, Rfl: 0   Multiple Vitamin (MULTIVITAMIN) tablet, Take 1 tablet by mouth daily., Disp: , Rfl:    sertraline (ZOLOFT) 50 MG tablet, Take 1 tablet (50 mg total) by mouth daily., Disp: 90 tablet, Rfl: 3   Vital signs in last 24 hrs: Vitals:   01/19/23 1532  BP: 124/80  Pulse: 79  SpO2: 98%   Wt Readings from Last 3 Encounters:  01/19/23 133 lb (60.3 kg)  04/19/22 129 lb 12.8 oz (58.9 kg)  01/06/22 127 lb 3.2 oz (57.7 kg)    Physical Exam  General: well-appearing   CV: RRR, no JVD, no peripheral edema Resp: clear to auscultation bilaterally, normal RR and effort noted GI: soft, no tenderness, with active bowel sounds. No guarding or palpable organomegaly noted.   _____________________________________________ Assessment & Plan  Assessment: Lymphocytic colitis  Irritable bowel syndrome with diarrhea   Doing well on budesonide 6 mg daily, very uncommonly needs her hyoscyamine.  Plan: Advised to taper off budesonide down to 3 mg.  If diarrhea does not return after about 2 weeks, stop medicine altogether.  She was agreeable to doing this, but thinks she will probably wait until  July after she gets through the school year and some vacations.  So she will keep in touch with me by portal message with an update on how that is going and we can proceed accordingly.   - Amada Jupiter, MD    [facility]  Ladona Mow M Kadhim,acting as a scribe for Charlie Pitter III, MD.,have documented all relevant documentation on the behalf of Sherrilyn Rist, MD,as directed by  Sherrilyn Rist, MD while in the presence of Sherrilyn Rist, MD.   Marvis Repress III, MD, have reviewed all documentation for this visit. The documentation on 01/19/23 for the exam, diagnosis, procedures, and orders are all accurate and complete.

## 2023-01-19 NOTE — Patient Instructions (Signed)
Please follow up in one year.  _______________________________________________________  If your blood pressure at your visit was 140/90 or greater, please contact your primary care physician to follow up on this.  _______________________________________________________  If you are age 52 or older, your body mass index should be between 23-30. Your Body mass index is 24.33 kg/m. If this is out of the aforementioned range listed, please consider follow up with your Primary Care Provider.  If you are age 72 or younger, your body mass index should be between 19-25. Your Body mass index is 24.33 kg/m. If this is out of the aformentioned range listed, please consider follow up with your Primary Care Provider.   ________________________________________________________  The [facility] providers would like to encourage you to use [facility] to communicate with providers for non-urgent requests or questions.  Due to long hold times on the telephone, sending your provider a message by [facility] may be a faster and more efficient way to get a response.  Please allow 48 business hours for a response.  Please remember that this is for non-urgent requests.  _______________________________________________________ It was a pleasure to see you today!  Thank you for trusting me with your gastrointestinal care!

## 2023-02-23 ENCOUNTER — Other Ambulatory Visit: Payer: Self-pay | Admitting: Gastroenterology

## 2023-03-28 ENCOUNTER — Other Ambulatory Visit: Payer: Self-pay | Admitting: Family Medicine

## 2023-04-23 ENCOUNTER — Encounter: Payer: Self-pay | Admitting: Gastroenterology

## 2023-05-04 ENCOUNTER — Encounter: Payer: Self-pay | Admitting: Family Medicine

## 2023-05-05 ENCOUNTER — Other Ambulatory Visit: Payer: Self-pay

## 2023-05-05 MED ORDER — SERTRALINE HCL 50 MG PO TABS: 50.0000 mg | ORAL_TABLET | Freq: Every day | ORAL | 0 refills | Status: DC

## 2023-05-11 LAB — HM MAMMOGRAPHY

## 2023-05-16 ENCOUNTER — Encounter: Payer: Self-pay | Admitting: Family Medicine

## 2023-05-16 ENCOUNTER — Ambulatory Visit: Payer: 59 | Admitting: Family Medicine

## 2023-05-16 VITALS — BP 120/80 | HR 77 | Temp 98.3°F | Ht 62.0 in | Wt 129.6 lb

## 2023-05-16 DIAGNOSIS — I471 Supraventricular tachycardia, unspecified: Secondary | ICD-10-CM | POA: Diagnosis not present

## 2023-05-16 DIAGNOSIS — Z0000 Encounter for general adult medical examination without abnormal findings: Secondary | ICD-10-CM | POA: Diagnosis not present

## 2023-05-16 DIAGNOSIS — E871 Hypo-osmolality and hyponatremia: Secondary | ICD-10-CM

## 2023-05-16 DIAGNOSIS — K52832 Lymphocytic colitis: Secondary | ICD-10-CM | POA: Diagnosis not present

## 2023-05-16 DIAGNOSIS — K580 Irritable bowel syndrome with diarrhea: Secondary | ICD-10-CM

## 2023-05-16 DIAGNOSIS — R748 Abnormal levels of other serum enzymes: Secondary | ICD-10-CM | POA: Diagnosis not present

## 2023-05-16 LAB — LIPID PANEL
Cholesterol: 164 mg/dL (ref 0–200)
HDL: 34.9 mg/dL — ABNORMAL LOW (ref >39.00)
NonHDL: 128.78
Total CHOL/HDL Ratio: 5
Triglycerides: 218 mg/dL — ABNORMAL HIGH (ref 0.0–149.0)
VLDL: 43.6 mg/dL — ABNORMAL HIGH (ref 0.0–40.0)

## 2023-05-16 LAB — COMPREHENSIVE METABOLIC PANEL
ALT: 43 U/L — ABNORMAL HIGH (ref 0–35)
AST: 24 U/L (ref 0–37)
Albumin: 4.2 g/dL (ref 3.5–5.2)
Alkaline Phosphatase: 73 U/L (ref 39–117)
BUN: 11 mg/dL (ref 6–23)
CO2: 25 mEq/L (ref 19–32)
Calcium: 9.4 mg/dL (ref 8.4–10.5)
Chloride: 100 mEq/L (ref 96–112)
Creatinine, Ser: 0.83 mg/dL (ref 0.40–1.20)
GFR: 81.19 mL/min (ref >60.00)
Glucose, Bld: 94 mg/dL (ref 70–99)
Potassium: 4.2 mEq/L (ref 3.5–5.1)
Sodium: 132 mEq/L — ABNORMAL LOW (ref 135–145)
Total Bilirubin: 0.3 mg/dL (ref 0.2–1.2)
Total Protein: 7 g/dL (ref 6.0–8.3)

## 2023-05-16 LAB — TSH: TSH: 0.84 u[IU]/mL (ref 0.35–5.50)

## 2023-05-16 LAB — CBC WITH DIFFERENTIAL/PLATELET
Basophils Absolute: 0 10*3/uL (ref 0.0–0.1)
Basophils Relative: 0.4 % (ref 0.0–3.0)
Eosinophils Absolute: 0.1 10*3/uL (ref 0.0–0.7)
Eosinophils Relative: 1 % (ref 0.0–5.0)
HCT: 41.6 % (ref 36.0–46.0)
Hemoglobin: 13.8 g/dL (ref 12.0–15.0)
Lymphocytes Relative: 21.5 % (ref 12.0–46.0)
Lymphs Abs: 1.6 10*3/uL (ref 0.7–4.0)
MCHC: 33.1 g/dL (ref 30.0–36.0)
MCV: 92.8 fl (ref 78.0–100.0)
Monocytes Absolute: 0.5 10*3/uL (ref 0.1–1.0)
Monocytes Relative: 6.6 % (ref 3.0–12.0)
Neutro Abs: 5.3 10*3/uL (ref 1.4–7.7)
Neutrophils Relative %: 70.5 % (ref 43.0–77.0)
Platelets: 360 10*3/uL (ref 150.0–400.0)
RBC: 4.48 Mil/uL (ref 3.87–5.11)
RDW: 12.3 % (ref 11.5–15.5)
WBC: 7.5 10*3/uL (ref 4.0–10.5)

## 2023-05-16 LAB — LDL CHOLESTEROL, DIRECT: Direct LDL: 117 mg/dL

## 2023-05-16 LAB — VITAMIN D 25 HYDROXY (VIT D DEFICIENCY, FRACTURES): VITD: 63.99 ng/mL (ref 30.00–100.00)

## 2023-05-16 MED ORDER — SERTRALINE HCL 50 MG PO TABS: 50.0000 mg | ORAL_TABLET | Freq: Every day | ORAL | 0 refills | Status: DC

## 2023-05-16 NOTE — Progress Notes (Signed)
Subjective  Chief Complaint  Patient presents with   Annual Exam    Pt here for Annual Exam and is currently not fasting     HPI: Kelsey Johnson is a 52 y.o. female who presents to [facility] [place] today for a Female Wellness Visit. She also has the concerns and/or needs as listed above in the chief complaint. These will be addressed in addition to the Health Maintenance Visit.   Wellness Visit: annual visit with health maintenance review and exam without Pap  HM: Pap done last year with Abelino Derrick, CNM and mammo done last week at gyn office. Normal results. Reviewed last colonoscopy; screens current. Healthy lifestyle. Had nice summer vacation and starting back to school next week. Imms current.  Chronic disease f/u and/or acute problem visit: (deemed necessary to be done in addition to the wellness visit): Lymphocytic colitis: has been stable for last month since weaning off budesonide. No IBS sxs either. Hopeful No palpitations or chest pain  Assessment  1. Annual physical exam   2. Paroxysmal SVT (supraventricular tachycardia) (HCC)   3. Lymphocytic colitis   4. Irritable bowel syndrome with diarrhea      Plan  Female Wellness Visit: Age appropriate Health Maintenance and Prevention measures were discussed with patient. Included topics are cancer screening recommendations, ways to keep healthy (see AVS) including dietary and exercise recommendations, regular eye and dental care, use of seat belts, and avoidance of moderate alcohol use and tobacco use. All screens are current BMI: discussed patient's BMI and encouraged positive lifestyle modifications to help get to or maintain a target BMI. HM needs and immunizations were addressed and ordered. See below for orders. See HM and immunization section for updates. UTD Routine labs and screening tests ordered including cmp, cbc and lipids where appropriate. Discussed recommendations regarding Vit D and calcium  supplementation (see AVS)  Chronic disease management visit and/or acute problem visit: Monitor for recurrent diarrhea off meds.  Follow up: 12 mo for cpe  Orders Placed This Encounter  Procedures   VITAMIN D 25 Hydroxy (Vit-D Deficiency, Fractures)   CBC with Differential/Platelet   Comprehensive metabolic panel   Lipid panel   TSH   Meds ordered this encounter  Medications   sertraline (ZOLOFT) 50 MG tablet    Sig: Take 1 tablet (50 mg total) by mouth daily.    Dispense:  15 tablet    Refill:  0      Body mass index is 23.7 kg/m. Wt Readings from Last 3 Encounters:  05/16/23 129 lb 9.6 oz (58.8 kg)  01/19/23 133 lb (60.3 kg)  04/19/22 129 lb 12.8 oz (58.9 kg)     Patient Active Problem List   Diagnosis Date Noted Date Diagnosed   Lymphocytic colitis 08/27/2020    Oral contraceptive use 08/27/2020    Perimenopausal 08/27/2020    Palpitations 06/15/2017    Inflammatory arthritis 05/19/2017     Followed at [facility]. 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 Controlled on Celebrex. 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    Asthma 07/27/2011    Irritable bowel syndrome 07/27/2011    Paroxysmal SVT (supraventricular tachycardia) (HCC) 07/27/2011    Health Maintenance  Topic Date Due   INFLUENZA VACCINE  07/19/2023 (Originally 05/05/2023)   MAMMOGRAM  05/08/2024   PAP SMEAR-Modifier  05/05/2027   Colonoscopy  04/28/2030   DTaP/Tdap/Td (3 - Td or Tdap) 04/19/2032   Hepatitis C Screening  Completed  HIV Screening  Completed   Zoster Vaccines- Shingrix  Completed   HPV VACCINES  Aged Out   COVID-19 Vaccine  Discontinued   Immunization History  Administered Date(s) Administered   Influenza,inj,Quad PF,6+ Mos 07/13/2018, 07/24/2019, 08/27/2020   PPD Test 04/26/2017   Tdap 07/28/2011, 04/19/2022   Zoster Recombinant(Shingrix) 04/19/2022, 09/29/2022   We updated and reviewed the patient's past history in detail and it is documented  below. Allergies: Patient is allergic to sulfa antibiotics. Past Medical History Patient  has a past medical history of Anxiety, Arthritis, CTIN (chronic tubulo-interstitial nephritis), IBS (irritable bowel syndrome), Kleine-Levin syndrome (2002), Microscopic colitis, and Supraventricular tachycardia (2011). Past Surgical History Patient  has a past surgical history that includes Tympanostomy tube placement and Adenoidectomy. Family History: Patient family history includes Alcoholism in her maternal grandfather; Asthma in her daughter; Diabetes in her father and paternal grandfather; Healthy in her daughter, daughter, and son; Heart disease in her maternal grandfather. Social History:  Patient  reports that she has never smoked. She has never used smokeless tobacco. She reports that she does not drink alcohol and does not use drugs.  Review of Systems: Constitutional: negative for fever or malaise Ophthalmic: negative for photophobia, double vision or loss of vision Cardiovascular: negative for chest pain, dyspnea on exertion, or new LE swelling Respiratory: negative for SOB or persistent cough Gastrointestinal: negative for abdominal pain, change in bowel habits or melena Genitourinary: negative for dysuria or gross hematuria, no abnormal uterine bleeding or disharge Musculoskeletal: negative for new gait disturbance or muscular weakness Integumentary: negative for new or persistent rashes, no breast lumps Neurological: negative for TIA or stroke symptoms Psychiatric: negative for SI or delusions Allergic/Immunologic: negative for hives  Patient Care Team    Relationship Specialty Notifications Start End  Willow Ora, MD PCP - General Family Medicine  12/12/19   Nigel Bridgeman, CNM Midwife Obstetrics and Gynecology  05/14/19   Sherrilyn Rist, MD Consulting Physician Gastroenterology  12/12/19   Nahser, Deloris Ping, MD Consulting Physician Cardiology  12/12/19     Objective  Vitals:  BP 120/80   Pulse 77   Temp 98.3 F (36.8 C)   Ht 5\' 2"  (1.575 m)   Wt 129 lb 9.6 oz (58.8 kg)   SpO2 97%   BMI 23.70 kg/m  General:  Well developed, well nourished, no acute distress  Psych:  Alert and orientedx3,normal mood and affect HEENT:  Normocephalic, atraumatic, non-icteric sclera,  supple neck without adenopathy, mass or thyromegaly Cardiovascular:  Normal S1, S2, RRR without gallop, rub or murmur Respiratory:  Good breath sounds bilaterally, CTAB with normal respiratory effort Gastrointestinal: normal bowel sounds, soft, non-tender, no noted masses. No HSM MSK: extremities without edema, joints without erythema or swelling Neurologic:    Mental status is normal.  Gross motor and sensory exams are normal.  No tremor  Commons side effects, risks, benefits, and alternatives for medications and treatment plan prescribed today were discussed, and the patient expressed understanding of the given instructions. Patient is instructed to call or message via MyChart if he/she has any questions or concerns regarding our treatment plan. No barriers to understanding were identified. We discussed Red Flag symptoms and signs in detail. Patient expressed understanding regarding what to do in case of urgent or emergency type symptoms.  Medication list was reconciled, printed and provided to the patient in AVS. Patient instructions and summary information was reviewed with the patient as documented in the AVS. This note was prepared with assistance  of Conservation officer, historic buildings. Occasional wrong-word or sound-a-like substitutions may have occurred due to the inherent limitations of voice recognition software

## 2023-05-17 ENCOUNTER — Other Ambulatory Visit: Payer: Self-pay | Admitting: Family Medicine

## 2023-05-17 NOTE — Addendum Note (Signed)
Addended by: Asencion Partridge on: 05/17/2023 08:03 PM   Modules accepted: Orders

## 2023-05-17 NOTE — Progress Notes (Signed)
Labs reviewed.  The 10-year ASCVD risk score (Arnett DK, et al., 2019) is: 1.7%   Values used to calculate the score:     Age: 52 years     Sex: Female     Is Non-Hispanic African American: No     Diabetic: No     Tobacco smoker: No     Systolic Blood Pressure: 120 mmHg     Is BP treated: No     HDL Cholesterol: 34.9 mg/dL     Total Cholesterol: 164 mg/dL  Dear Kelsey Johnson, Thank you for allowing me to care for you at your recent office visit.  I wanted to let you know that I have reviewed your lab test results and am happy to report that they are mostly good.  Your thyroid level, kidney function, vitamin D and blood counts are all normal. Your cholesterol levels are ok overall but your HDL or good cholesterol is on the low side. Exercise can help improve this number; it is heart protective and we'd like it in the 40s at least.  And finally, your sodium is a little low. This is not a dangerous level and could be coming from the zoloft. I will monitor it for you. We can consider checking again in 3-6 months to see where it lands. One of your liver tests is just above normal as well. Both things could be rechecked.  Sincerely, Dr. Mardelle Matte

## 2023-05-20 ENCOUNTER — Other Ambulatory Visit: Payer: Self-pay | Admitting: Family Medicine

## 2023-05-21 ENCOUNTER — Encounter: Payer: Self-pay | Admitting: Family Medicine

## 2023-05-25 ENCOUNTER — Other Ambulatory Visit: Payer: Self-pay

## 2023-05-25 MED ORDER — SERTRALINE HCL 50 MG PO TABS: 50.0000 mg | ORAL_TABLET | Freq: Every day | ORAL | 0 refills | Status: DC

## 2023-07-08 ENCOUNTER — Encounter: Payer: Self-pay | Admitting: Family

## 2023-07-08 ENCOUNTER — Ambulatory Visit: Payer: 59 | Admitting: Family

## 2023-07-08 VITALS — BP 125/81 | HR 84 | Temp 97.8°F | Ht 62.0 in | Wt 131.4 lb

## 2023-07-08 DIAGNOSIS — L237 Allergic contact dermatitis due to plants, except food: Secondary | ICD-10-CM

## 2023-07-08 NOTE — Progress Notes (Signed)
Patient ID: Kelsey Johnson, female    DOB: 04-20-1971, 52 y.o.   MRN: [id number]  Chief Complaint  Patient presents with   Rash    Pt c/o red rash on right wrist , hairline and left side of face, Present since yesterday. Has tried creams which did not help.    *Discussed the use of AI scribe software for clinical note transcription with the patient, who gave verbal consent to proceed.  History of Present Illness   The patient presents with a new rash that started yesterday. The rash is located on the forehead, cheeks, and right wrist. She describes the rash as 'little spots' on the face and a larger area on the wrist. She denies any recent outdoor activities but did cut some brush back a few days ago. She has a history of poison ivy exposure about ten years ago, which resulted in a severe reaction in which received a steroid shot, developed a bad local reaction to the shot that turned into an abscess. She recently started meloxicam after a course of prednisone for plantar fasciitis. She denies any other new medications or changes in health. She has been using over-the-counter cortisone cream for the rash, which has helped with the itching.      Assessment & Plan:     Contact Dermatitis - Recent onset of rash on forehead, cheek, and right wrist. History of similar reaction to poison ivy/oak. No recent exposure to new medications or substances, but recent brush cutting could have exposed to poison ivy/oak. Itching present but manageable. -Continue over-the-counter cortisone cream. -Consider over-the-counter Benadryl cream or Calamine lotion for additional relief. -Monitor for new areas of rash or worsening itching. If condition worsens, consider oral prednisone.      Subjective:    Outpatient Medications Prior to Visit  Medication Sig Dispense Refill   BLISOVI FE 1/20 1-20 MG-MCG tablet Take by mouth.     meloxicam (MOBIC) 15 MG tablet Take 15 mg by mouth daily.     Multiple Vitamin  (MULTIVITAMIN) tablet Take 1 tablet by mouth daily.     sertraline (ZOLOFT) 50 MG tablet Take 1 tablet (50 mg total) by mouth daily. 90 tablet 0   No facility-administered medications prior to visit.   Past Medical History:  Diagnosis Date   Anxiety    from IBS   Arthritis    CTIN (chronic tubulo-interstitial nephritis)    IBS (irritable bowel syndrome)    Kleine-Levin syndrome 2002   Microscopic colitis    Supraventricular tachycardia (HCC) 2011   Dr. Elease Hashimoto   Past Surgical History:  Procedure Laterality Date   ADENOIDECTOMY     As a child   TYMPANOSTOMY TUBE PLACEMENT     As a child   Allergies  Allergen Reactions   Sulfa Antibiotics Other (See Comments)    Reaction unknown child      Objective:    Physical Exam Vitals and nursing note reviewed.  Constitutional:      Appearance: Normal appearance.  HENT:     Head:   Cardiovascular:     Rate and Rhythm: Normal rate and regular rhythm.  Pulmonary:     Effort: Pulmonary effort is normal.     Breath sounds: Normal breath sounds.  Musculoskeletal:        General: Normal range of motion.       Arms:  Skin:    General: Skin is warm and dry.     Findings: Rash (hive-like, pink streaks on  left forehead next to scalp, several small bumps on left cheek and neck, patch on inside right wrist) present.  Neurological:     Mental Status: She is alert.  Psychiatric:        Mood and Affect: Mood normal.        Behavior: Behavior normal.    BP 125/81 (BP Location: Left Arm, Patient Position: Sitting, Cuff Size: Normal)   Pulse 84   Temp 97.8 F (36.6 C) (Temporal)   Ht 5\' 2"  (1.575 m)   Wt 131 lb 6.4 oz (59.6 kg)   SpO2 99%   BMI 24.03 kg/m  Wt Readings from Last 3 Encounters:  07/08/23 131 lb 6.4 oz (59.6 kg)  05/16/23 129 lb 9.6 oz (58.8 kg)  01/19/23 133 lb (60.3 kg)       Dulce Sellar, NP

## 2023-08-08 ENCOUNTER — Encounter: Payer: Self-pay | Admitting: Gastroenterology

## 2023-08-09 ENCOUNTER — Other Ambulatory Visit: Payer: Self-pay | Admitting: Family Medicine

## 2023-08-31 ENCOUNTER — Encounter: Payer: Self-pay | Admitting: Family Medicine

## 2023-08-31 ENCOUNTER — Other Ambulatory Visit (INDEPENDENT_AMBULATORY_CARE_PROVIDER_SITE_OTHER): Payer: 59

## 2023-08-31 DIAGNOSIS — R748 Abnormal levels of other serum enzymes: Secondary | ICD-10-CM | POA: Diagnosis not present

## 2023-08-31 DIAGNOSIS — E871 Hypo-osmolality and hyponatremia: Secondary | ICD-10-CM | POA: Diagnosis not present

## 2023-08-31 LAB — COMPREHENSIVE METABOLIC PANEL
ALT: 20 U/L (ref 0–35)
AST: 17 U/L (ref 0–37)
Albumin: 4 g/dL (ref 3.5–5.2)
Alkaline Phosphatase: 76 U/L (ref 39–117)
BUN: 14 mg/dL (ref 6–23)
CO2: 28 meq/L (ref 19–32)
Calcium: 9.2 mg/dL (ref 8.4–10.5)
Chloride: 102 meq/L (ref 96–112)
Creatinine, Ser: 0.86 mg/dL (ref 0.40–1.20)
GFR: 77.64 mL/min (ref >60.00)
Glucose, Bld: 76 mg/dL (ref 70–99)
Potassium: 4.3 meq/L (ref 3.5–5.1)
Sodium: 137 meq/L (ref 135–145)
Total Bilirubin: 0.4 mg/dL (ref 0.2–1.2)
Total Protein: 7 g/dL (ref 6.0–8.3)

## 2023-09-25 ENCOUNTER — Encounter: Payer: Self-pay | Admitting: Gastroenterology

## 2023-09-26 ENCOUNTER — Other Ambulatory Visit: Payer: Self-pay

## 2023-09-26 DIAGNOSIS — K52832 Lymphocytic colitis: Secondary | ICD-10-CM

## 2023-09-26 MED ORDER — BUDESONIDE 3 MG PO CPEP
9.0000 mg | ORAL_CAPSULE | Freq: Every day | ORAL | 2 refills | Status: DC
Start: 2023-09-26 — End: 2024-06-06

## 2023-09-26 NOTE — Telephone Encounter (Signed)
She has microscopic colitis that has periodically required courses of budesonide that have worked well for her.. This is probably what is going on here again.  No new meds for this condition since I last saw her.  I recommend the budesonide 3 mg capsules, take 3 capsules (9 mg) once daily until I can next see her, even if that is a few months from now. Please refill at that dose so she has enough.  As-needed imodium.  If that is not working well enough, let us know and we can prescribe Lomotil.  If I can get in sooner with a cancellation, than I certainly will.  - H. Danis

## 2023-09-26 NOTE — Telephone Encounter (Signed)
Pt was made aware of Dr. Myrtie Neither recommendations: Prescription was sent to pt pharmacy: Pt made aware Pt scheduled to see Dr. Myrtie Neither on 12/20/2023 at 3:30 PM.  Pt made aware Pt verbalized understanding with all questions answered.

## 2023-10-12 ENCOUNTER — Encounter: Payer: Self-pay | Admitting: Family Medicine

## 2023-11-03 ENCOUNTER — Encounter: Payer: Self-pay | Admitting: Family Medicine

## 2023-11-03 ENCOUNTER — Ambulatory Visit: Payer: 59 | Admitting: Family Medicine

## 2023-11-03 VITALS — BP 110/69 | HR 83 | Temp 97.7°F | Ht 62.0 in | Wt 131.0 lb

## 2023-11-03 DIAGNOSIS — R051 Acute cough: Secondary | ICD-10-CM | POA: Diagnosis not present

## 2023-11-03 DIAGNOSIS — J101 Influenza due to other identified influenza virus with other respiratory manifestations: Secondary | ICD-10-CM

## 2023-11-03 LAB — POCT INFLUENZA A/B
Influenza A, POC: POSITIVE — AB
Influenza B, POC: NEGATIVE

## 2023-11-03 LAB — POC COVID19 BINAXNOW: SARS Coronavirus 2 Ag: NEGATIVE

## 2023-11-03 NOTE — Progress Notes (Signed)
Subjective  CC:  Chief Complaint  Patient presents with   Cough    Pt stated that she has had a cough since 10/31/2023 and it has gotten worse and was running a fever this morning of 101.0.     HPI: Kelsey Johnson is a 53 y.o. female who presents to the office today to address the problems listed above in the chief complaint. Discussed the use of AI scribe software for clinical note transcription with the patient, who gave verbal consent to proceed.  History of Present Illness   The patient presents with flu-like symptoms including fever, scratchy throat, and cough.  Symptoms began on Monday with a scratchy throat, which progressed to a painful cough, particularly in the chest, and is not constant. She also experiences some shortness of breath.  She developed a fever this morning, for which she took medication, resulting in a reduction of the fever. She reports mild achiness but not as severe as previous experiences with fever. No nausea, vomiting, or diarrhea.  She notes a headache, which she attributes to her current illness. She has a history of receiving the flu vaccine, which she believes has mitigated the severity of her symptoms.  There is a family history of respiratory illnesses, with her daughter having had pneumonia and another family member having had bronchitis recently.       Assessment  1. Influenza A   2. Acute cough      Plan  Assessment and Plan    Influenza A Positive flu test with symptoms of cough, chest discomfort, fever, and headache. Vaccinated, which likely mitigated severity of symptoms. -pt defers use of tamiflu. This is reasonable.  -Advise rest, hydration, and over-the-counter symptomatic treatment with Advil and Delsym or Mucinex DM. -If fever persists, recommend staying home from work.   Orders Placed This Encounter  Procedures   HM PAP SMEAR   POC COVID-19   POCT Influenza A/B   No orders of the defined types were placed in this  encounter.    I reviewed the patients updated PMH, FH, and SocHx.    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   budesonide (ENTOCORT EC) 3 MG 24 hr capsule Take 3 capsules (9 mg total) by mouth daily.   meloxicam (MOBIC) 15 MG tablet Take 15 mg by mouth daily.   Multiple Vitamin (MULTIVITAMIN) tablet Take 1 tablet by mouth daily.   sertraline (ZOLOFT) 50 MG tablet TAKE 1 TABLET DAILY    Allergies: Patient is allergic to sulfa antibiotics. Family History: Patient family history includes Alcoholism in her maternal grandfather; Asthma in her daughter; Diabetes in her father and paternal grandfather; Healthy in her daughter, daughter, and son; Heart disease in her maternal grandfather. Social History:  Patient  reports that she has never smoked. She has never used smokeless tobacco. She reports that she does not drink alcohol and does not use  drugs.  Review of Systems: Constitutional: Negative for fever malaise or anorexia Cardiovascular: negative for chest pain Respiratory: negative for SOB or persistent cough Gastrointestinal: negative for abdominal pain  Objective  Vitals: BP 110/69   Pulse 83   Temp 97.7 F (36.5 C)   Ht 5\' 2"  (1.575 m)   Wt 131 lb (59.4 kg)   SpO2 97%   BMI 23.96 kg/m  General: no acute distress , A&Ox3 HEENT: PEERL, conjunctiva normal, neck is supple, TM clear bilaterally Cardiovascular:  RRR without murmur or gallop.  Respiratory:   Good breath sounds bilaterally, CTAB with normal respiratory effort, no rales or rhonchi or wheezing Skin:  Warm, no rashes  Office Visit on 11/03/2023  Component Date Value Ref Range Status   SARS Coronavirus 2 Ag 11/03/2023 Negative  Negative Final   Influenza A, POC 11/03/2023 Positive (A)  Negative Final   Influenza B, POC 11/03/2023 Negative  Negative Final   HM Pap smear 05/04/2022 normal   Final    Commons side effects, risks, benefits, and alternatives for medications and treatment plan prescribed today were discussed, and the patient expressed understanding of the given instructions. Patient is instructed to call or message via MyChart if he/she has any questions or concerns regarding our treatment plan. No barriers to understanding were identified. We discussed Red Flag symptoms and signs in detail. Patient expressed understanding regarding what to do in case of urgent or emergency type symptoms.  Medication list was reconciled, printed and provided to the patient in AVS. Patient instructions and summary information was reviewed with the patient as documented in the AVS. This note was prepared with assistance of Dragon voice recognition software. Occasional wrong-word or sound-a-like substitutions may have occurred due to the inherent limitations of voice recognition software

## 2023-11-03 NOTE — Patient Instructions (Addendum)
Please follow up as scheduled for your next visit with me: 11/07/2023   If you have any questions or concerns, please don't hesitate to send me a message via MyChart or call the office at [phone]. Thank you for visiting with [place] today! It's our pleasure caring for you.   VISIT SUMMARY:  You came in today with flu-like symptoms including fever, scratchy throat, and cough. After evaluating your symptoms and conducting a flu test, it was confirmed that you have Influenza A. You also mentioned experiencing a headache, which we will continue to monitor.  YOUR PLAN:  -INFLUENZA A: Influenza A is a viral infection that affects your respiratory system. You tested positive for this flu strain, and your symptoms include cough, chest discomfort, fever, and headache. Since you have been vaccinated, your symptoms are likely less severe. The plan is to rest, stay hydrated, and use over-the-counter medications like Advil for fever and Delsym or Mucinex DM for your cough. If your fever continues, please stay home from work.  -HEADACHE: You have been experiencing headaches, which we will continue to manage until your next appointment. Please follow your current treatment plan until then.  INSTRUCTIONS:  If your fever persists, please stay home from work. Continue with your current headache management plan until your next appointment.

## 2023-11-04 ENCOUNTER — Encounter: Payer: Self-pay | Admitting: Family Medicine

## 2023-11-06 ENCOUNTER — Encounter: Payer: Self-pay | Admitting: Family Medicine

## 2023-11-07 ENCOUNTER — Ambulatory Visit: Payer: 59 | Admitting: Family Medicine

## 2023-11-09 ENCOUNTER — Ambulatory Visit: Payer: 59 | Admitting: Family Medicine

## 2023-11-16 ENCOUNTER — Ambulatory Visit: Payer: 59 | Admitting: Family Medicine

## 2023-12-05 ENCOUNTER — Ambulatory Visit: Payer: 59 | Admitting: Family Medicine

## 2023-12-05 ENCOUNTER — Encounter: Payer: Self-pay | Admitting: Family Medicine

## 2023-12-05 VITALS — BP 138/88 | HR 76 | Temp 97.9°F | Ht 62.0 in | Wt 133.0 lb

## 2023-12-05 DIAGNOSIS — G43019 Migraine without aura, intractable, without status migrainosus: Secondary | ICD-10-CM | POA: Insufficient documentation

## 2023-12-05 DIAGNOSIS — M1990 Unspecified osteoarthritis, unspecified site: Secondary | ICD-10-CM

## 2023-12-05 DIAGNOSIS — Z3041 Encounter for surveillance of contraceptive pills: Secondary | ICD-10-CM

## 2023-12-05 HISTORY — DX: Migraine without aura, intractable, without status migrainosus: G43.019

## 2023-12-05 MED ORDER — RIZATRIPTAN BENZOATE 10 MG PO TBDP: 10.0000 mg | ORAL_TABLET | ORAL | 0 refills | Status: DC | PRN

## 2023-12-05 NOTE — Patient Instructions (Addendum)
 Please follow up if symptoms do not improve or as needed.    Let me know how the headache medication works for you. I looked back at your record and we discussed it back in November 2021! They sound migrainous to me and I'm hopeful we can get them calmed down for you.   Migraine Headache A migraine headache is an intense pulsing or throbbing pain on one or both sides of the head. Migraine headaches may also cause other symptoms, such as nausea, vomiting, and sensitivity to light and noise. A migraine headache can last from 4 hours to 3 days. Talk with your health care provider about what things may bring on (trigger) your migraine headaches. What are the causes? The exact cause is not known. However, a migraine may be caused when nerves in the brain get irritated and release chemicals that cause blood vessels to become inflamed. This inflammation causes pain. Migraines may be triggered or caused by: Smoking. Medicines, such as: Nitroglycerin, which is used to treat chest pain. Birth control pills. Estrogen. Certain blood pressure medicines. Foods or drinks that contain nitrates, glutamate, aspartame, MSG, or tyramine. Certain foods or drinks, such as aged cheeses, chocolate, alcohol, or caffeine. Doing physical activity that is very hard. Other triggers may include: Menstruation. Pregnancy. Hunger. Stress. Getting too much or too little sleep. Weather changes. Tiredness (fatigue). What increases the risk? The following factors may make you more likely to have migraine headaches: Being between the ages of 76-39 years old. Being female. Having a family history of migraine headaches. Being Caucasian. Having a mental health condition, such as depression or anxiety. Being obese. What are the signs or symptoms? The main symptom of this condition is pulsing or throbbing pain. This pain may: Happen in any area of the head, such as on one or both sides. Make it hard to do daily  activities. Get worse with physical activity. Get worse around bright lights, loud noises, or smells. Other symptoms may include: Nausea. Vomiting. Dizziness. Before a migraine headache starts, you may get warning signs (an aura). An aura may include: Seeing flashing lights or having blind spots. Seeing bright spots, halos, or zigzag lines. Having tunnel vision or blurred vision. Having numbness or a tingling feeling. Having trouble talking. Having muscle weakness. After a migraine ends, you may have symptoms. These may include: Feeling tired. Trouble concentrating. How is this diagnosed? A migraine headache can be diagnosed based on: Your symptoms. A physical exam. Tests, such as: A CT scan or an MRI of the head. These tests can help rule out other causes of headaches. Taking fluid from the spine (lumbar puncture) to examine it (cerebrospinal fluid analysis, or CSF analysis). How is this treated? This condition may be treated with medicines that: Relieve pain and nausea. Prevent migraines. Treatment may also include: Acupuncture. Lifestyle changes like avoiding foods that trigger migraine headaches. Learning ways to control your body (biofeedback). Talk therapy to help you know and deal with negative thoughts (cognitive behavioral therapy). Follow these instructions at home: Medicines Take over-the-counter and prescription medicines only as told by your provider. Ask your provider if the medicine prescribed to you: Requires you to avoid driving or using machinery. Can cause constipation. You may need to take these actions to prevent or treat constipation: Drink enough fluid to keep your pee (urine) pale yellow. Take over-the-counter or prescription medicines. Eat foods that are high in fiber, such as beans, whole grains, and fresh fruits and vegetables. Limit foods that are high  in fat and processed sugars, such as fried or sweet foods. Lifestyle  Do not drink  alcohol. Do not use any products that contain nicotine or tobacco. These products include cigarettes, chewing tobacco, and vaping devices, such as e-cigarettes. If you need help quitting, ask your provider. Get 7-9 hours of sleep each night, or the amount recommended by your provider. Find ways to manage stress, such as meditation, deep breathing, or yoga. Try to exercise regularly. This can help lessen how bad and how often your migraines occur. General instructions Keep a journal to find out what triggers your migraines, so you can avoid those things. For example, write down: What you eat and drink. How much sleep you get. Any change to your diet or medicines. If you have a migraine headache: Avoid things that make your symptoms worse, such as bright lights. Lie down in a dark, quiet room. Do not drive or use machinery. Ask your provider what activities are safe for you while you have symptoms. Keep all follow-up visits. Your provider will monitor your symptoms and recommend any further treatment. Where to find more information Coalition for Headache and Migraine Patients ([facility]): headachemigraine.org American Migraine Foundation: americanmigrainefoundation.org National Headache Foundation: headaches.org Contact a health care provider if: You have symptoms that are different or worse than your usual migraine headache symptoms. You have more than 15 days of headaches in one month. Get help right away if: Your migraine headache becomes severe or lasts more than 72 hours. You have a fever or stiff neck. You have vision loss. Your muscles feel weak or like you cannot control them. You lose your balance often or have trouble walking. You faint. You have a seizure. This information is not intended to replace advice given to you by your health care provider. Make sure you discuss any questions you have with your health care provider. Document Revised: 05/17/2022 Document Reviewed:  05/17/2022 Elsevier Patient Education  2024 [employer].

## 2023-12-05 NOTE — Progress Notes (Signed)
 Subjective  CC:  Chief Complaint  Patient presents with   Headache         HPI: Kelsey Johnson is a 53 y.o. female who presents to the office today to address the problems listed above in the chief complaint. 53 yo c/o classic migraine type headaches: starts with dullness on right side, progressed to moderate throbbing headache associated with photo and phonophobia. In spite of otc medicines, sxs will persist x 3 days until resolve. No associated neurologic deficits. No neck stiffness or fevers. Typically occurring once a month since July of last year. Havent' changed in quality or frequency or intensity. Mom had migraines. Pt reports has had similar headaches: chart review shows discussion about in in November 2021; sxs improved so no imaging was done. She is on continue oral combined contraception w/o menses. Some stressors and lack of sleep but mood is well controlled on zoloft. Maybe notices occurs with weather changes. No other obvious triggers. Not debilitating but uncomfortable and negatively impacts life: more irritable, tired.  Takes daily mobic for arthitis.  No htn. On steroids for colitis which is controlled.    Assessment  1. Intractable migraine without aura and without status migrainosus   2. Oral contraceptive use   3. Inflammatory arthritis      Plan  migraine:  discussed new diagnosis and treatment options. Maxalt to start. Education and expectations discussed. Discussed red flags sxs.  Continue ocps.  Avoid other nsaids.   Follow up: prn Visit date not found  No orders of the defined types were placed in this encounter.  Meds ordered this encounter  Medications   rizatriptan (MAXALT-MLT) 10 MG disintegrating tablet    Sig: Take 1 tablet (10 mg total) by mouth as needed for migraine. May repeat in 2 hours if needed    Dispense:  10 tablet    Refill:  0      I reviewed the patients updated PMH, FH, and SocHx.    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   budesonide (ENTOCORT EC) 3 MG 24 hr capsule Take 3 capsules (9 mg total) by mouth daily.   meloxicam (MOBIC) 15 MG tablet Take 15 mg by mouth daily.   Multiple Vitamin (MULTIVITAMIN) tablet Take 1 tablet by mouth daily.   rizatriptan (MAXALT-MLT) 10 MG disintegrating tablet Take 1 tablet (10 mg total) by mouth as needed for migraine. May repeat in 2 hours if needed   sertraline (ZOLOFT) 50 MG tablet TAKE 1 TABLET DAILY    Allergies: Patient is allergic to sulfa antibiotics. Family History: Patient family history includes Alcoholism in her maternal grandfather; Asthma in her daughter; Diabetes in her father and paternal grandfather; Healthy in her daughter, daughter, and son; Heart disease in her maternal grandfather. Social History:  Patient  reports  that she has never smoked. She has never used smokeless tobacco. She reports that she does not drink alcohol and does not use drugs.  Review of Systems: Constitutional: Negative for fever malaise or anorexia Cardiovascular: negative for chest pain Respiratory: negative for SOB or persistent cough Gastrointestinal: negative for abdominal pain  Objective  Vitals: BP 138/88   Pulse 76   Temp 97.9 F (36.6 C)   Ht 5\' 2"  (1.575 m)   Wt 133 lb (60.3 kg)   SpO2 98%   BMI 24.33 kg/m  General: no acute distress ,  A&Ox3 HEENT: PEERL, conjunctiva normal, neck is supple Skin:  Warm, no rashes Nonfocal neuro  Commons side effects, risks, benefits, and alternatives for medications and treatment plan prescribed today were discussed, and the patient expressed understanding of the given instructions. Patient is instructed to call or message via MyChart if he/she has any questions or concerns regarding our treatment plan. No barriers to understanding were identified. We discussed Red Flag symptoms and signs in detail. Patient expressed understanding regarding what to do in case of urgent or emergency type symptoms.  Medication list was reconciled, printed and provided to the patient in AVS. Patient instructions and summary information was reviewed with the patient as documented in the AVS. This note was prepared with assistance of Dragon voice recognition software. Occasional wrong-word or sound-a-like substitutions may have occurred due to the inherent limitations of voice recognition software

## 2023-12-20 ENCOUNTER — Ambulatory Visit: Payer: 59 | Admitting: Gastroenterology

## 2023-12-20 ENCOUNTER — Encounter: Payer: Self-pay | Admitting: Gastroenterology

## 2023-12-20 VITALS — BP 118/70 | HR 52 | Ht 62.0 in | Wt 133.0 lb

## 2023-12-20 DIAGNOSIS — K580 Irritable bowel syndrome with diarrhea: Secondary | ICD-10-CM

## 2023-12-20 DIAGNOSIS — K52832 Lymphocytic colitis: Secondary | ICD-10-CM

## 2023-12-20 NOTE — Progress Notes (Signed)
 [facility] Progress Note  Chief Complaint: Lymphocytic colitis  Subjective  History: Lymphocytic colitis diagnosed May 2021, GERD, and IBS. Takes budesonide episodically and as needed Levsin. Tried to taper down sertraline at 1 point thinking this might be contributing to the colitis, but flared up anxiety and had to resume. Contacted [place] November 2024 having a flareup of symptoms with concomitant stress of her son's wedding.  She decided to go back to budesonide few weeks later for persistent symptoms. __________ Kelsey Johnson is glad to report she is feeling back to normal on budesonide 6 mg daily.  She went back to that dose rather than the 9 mg because it had worked well in the past.  Bowel habits are now formed, appetite is good and weight stable.  No lower GI bleeding.  ROS: Cardiovascular:  no chest pain Respiratory: no dyspnea  The patient's Past Medical, Family and Social History were reviewed and are on file in the EMR. Past Medical History:  Diagnosis Date   Anxiety    from IBS   Arthritis    CTIN (chronic tubulo-interstitial nephritis)    IBS (irritable bowel syndrome)    Kleine-Levin syndrome 2002   Microscopic colitis    Supraventricular tachycardia (HCC) 2011   Dr. Elease Hashimoto    Past Surgical History:  Procedure Laterality Date   ADENOIDECTOMY     As a child   TYMPANOSTOMY TUBE PLACEMENT     As a child    Objective:  Med list reviewed  Current Outpatient Medications:    BLISOVI FE 1/20 1-20 MG-MCG tablet, Take by mouth., Disp: , Rfl:    budesonide (ENTOCORT EC) 3 MG 24 hr capsule, Take 3 capsules (9 mg total) by mouth daily. (Patient taking differently: Take 6 mg by mouth daily.), Disp: 270 capsule, Rfl: 2   Multiple Vitamin (MULTIVITAMIN) tablet, Take 1 tablet by mouth daily., Disp: , Rfl:    rizatriptan (MAXALT-MLT) 10 MG disintegrating tablet, Take 1 tablet (10 mg total) by mouth as needed for migraine. May repeat in 2 hours if needed, Disp: 10 tablet,  Rfl: 0   sertraline (ZOLOFT) 50 MG tablet, TAKE 1 TABLET DAILY, Disp: 90 tablet, Rfl: 3   Vital signs in last 24 hrs: Vitals:   12/20/23 1519  BP: 118/70  Pulse: (!) 52   Wt Readings from Last 3 Encounters:  12/20/23 133 lb (60.3 kg)  12/05/23 133 lb (60.3 kg)  11/03/23 131 lb (59.4 kg)    Physical Exam  Well-appearing Cardiac: Regular without appreciable murmur,  no peripheral edema Pulm: clear to auscultation bilaterally, normal RR and effort noted Abdomen: soft, no tenderness, with active bowel sounds. No guarding or palpable hepatosplenomegaly.   Labs:   ___________________________________________ Radiologic studies:   ____________________________________________ Other:   _____________________________________________ Assessment & Plan  Assessment: Encounter Diagnoses  Name Primary?   Lymphocytic colitis Yes   Irritable bowel syndrome with diarrhea    Flare symptoms several months ago after she had been off budesonide for several months.  Doing much better now on budesonide 6 mg daily.  I recommended she decrease to 3 mg once daily and, if still feeling well in 4 to 6 weeks, consider stopping it again if she feels it would be a reasonable time to do so.  She may not want to go off it if there is an upcoming trip for busy time at work, etc. I reassured her that it is a low risk medicine to take long-term especially low dose and  that they 99% first-pass metabolism makes a very low likelihood of steroid like side effects.  She should be on a calcium supplement in addition to her usual vitamin D and see primary care periodically for bone density study. Plan: We will refill meds for her when needed otherwise see her in a year or sooner if needed.  She was encouraged to periodically check in with portal message for clinical updates.   Kelsey Johnson

## 2023-12-20 NOTE — Patient Instructions (Signed)
   _______________________________________________________  If your blood pressure at your visit was 140/90 or greater, please contact your primary care physician to follow up on this.  _______________________________________________________  If you are age 53 or older, your body mass index should be between 23-30. Your Body mass index is 24.33 kg/m. If this is out of the aforementioned range listed, please consider follow up with your Primary Care Provider.  If you are age 17 or younger, your body mass index should be between 19-25. Your Body mass index is 24.33 kg/m. If this is out of the aformentioned range listed, please consider follow up with your Primary Care Provider.   ________________________________________________________  The [facility] providers would like to encourage you to use [facility] to communicate with providers for non-urgent requests or questions.  Due to long hold times on the telephone, sending your provider a message by [facility] may be a faster and more efficient way to get a response.  Please allow 48 business hours for a response.  Please remember that this is for non-urgent requests.  _______________________________________________________  It was a pleasure to see you today!  Thank you for trusting me with your gastrointestinal care!

## 2024-01-03 ENCOUNTER — Encounter: Payer: Self-pay | Admitting: Family Medicine

## 2024-01-06 MED ORDER — SUMATRIPTAN SUCCINATE 50 MG PO TABS: 50.0000 mg | ORAL_TABLET | ORAL | 0 refills | Status: DC | PRN

## 2024-03-12 ENCOUNTER — Ambulatory Visit: Admitting: Family Medicine

## 2024-03-12 ENCOUNTER — Encounter: Payer: Self-pay | Admitting: Family Medicine

## 2024-03-12 VITALS — BP 149/83 | HR 70 | Temp 97.7°F | Ht 62.0 in | Wt 136.2 lb

## 2024-03-12 DIAGNOSIS — Z3041 Encounter for surveillance of contraceptive pills: Secondary | ICD-10-CM | POA: Diagnosis not present

## 2024-03-12 DIAGNOSIS — G43009 Migraine without aura, not intractable, without status migrainosus: Secondary | ICD-10-CM | POA: Insufficient documentation

## 2024-03-12 MED ORDER — NURTEC 75 MG PO TBDP: 75.0000 mg | ORAL_TABLET | Freq: Every day | ORAL | Status: DC | PRN

## 2024-03-12 NOTE — Patient Instructions (Signed)
 Please follow up as scheduled for your next visit with me: 05/23/2024   If you have any questions or concerns, please don't hesitate to send me a message via MyChart or call the office at [phone]. Thank you for visiting with us  today! It's our pleasure caring for you.    VISIT SUMMARY: Today, we discussed your recurrent monthly headaches, which began in April and have been occurring monthly. We also talked about your postmenopausal status and weight gain concerns.  YOUR PLAN: -possible hormone related MIGRAINES: Your migraines are likely linked to hormonal changes due to your birth control pills and postmenopausal status. We recommend discontinuing your birth control pills to see if this helps with your migraines. We have also prescribed a new migraine medication (nurtec ODT)  that you can take at the onset of a headache and repeat in 24 hours if needed. We will monitor your migraines after you stop taking the birth control pills, and if they persist or worsen, we may refer you to a neurologist.  -POSTMENOPAUSAL STATUS: Given your age and the absence of menstrual cycles, you are likely postmenopausal, and birth control pills may no longer be necessary. We recommend discontinuing your birth control pills to confirm your postmenopausal status. We will check your hormone levels 2-4 weeks after you stop taking the pills. If you experience any menopausal symptoms, we can discuss how to manage them.  -WEIGHT GAIN: Your weight gain may be linked to menopause and the use of sertraline . We will monitor your weight and encourage you to continue your current diet and exercise routine. If the weight gain persists, we can discuss adjusting your sertraline  dosage.  INSTRUCTIONS: Please discontinue your birth control pills and monitor any changes in your migraines and weight. We will check your hormone levels in 2-4 weeks. If your migraines persist or worsen, or if you experience any menopausal symptoms, please  let us  know. Continue with your current diet and exercise routine, and we will discuss any necessary adjustments to your sertraline  if weight gain continues.                      Contains text generated by [employer].                                 Contains text generated by [employer].

## 2024-03-12 NOTE — Progress Notes (Signed)
 Subjective  CC:  Chief Complaint  Patient presents with   Headache    Pt state that the medication that she was prescribed has not help and the headaches are still the same    HPI: Kelsey Johnson is a 53 y.o. female who presents to the office today to address the problems listed above in the chief complaint. Discussed the use of AI scribe software for clinical note transcription with the patient, who gave verbal consent to proceed.  History of Present Illness Kelsey Johnson is a 53 year old female who presents with recurrent monthly headaches.  She experiences recurrent headaches that began on April 19th and have occurred monthly since, with the most recent episode on May 24th. Each episode lasts for three days and is characterized by unilateral pain, particularly severe at night when lying down. They are not the worst headaches of her life. No nausea or significant light sensitivity, although there is slight light sensitivity.  She has tried various medications, including two prescribed by her provider imitrex  and maxalt  and over-the-counter anti-inflammatories like Aleve and ibuprofen , without relief. She mentions taking up to four ibuprofen  in one day without effect.  Her last menstrual cycle was over a year ago, and she is currently on a monophasic birth control pill, which she has been taking for a long time. She takes the pill for three weeks and skips the last week. Her headaches are monthly and may correlate somewhat with the end of her active pill pack.   She is experiencing weight gain despite eating salads, exercising, and working with a trainer. She attributes this to menopause, similar to her mother's experience. She is currently taking sertraline .   Assessment  1. Migraine without aura and without status migrainosus, not intractable   2. Oral contraceptive use      Plan  Assessment and Plan Assessment & Plan Possible hormone related migraines Migraines possible  linked to hormonal changes due to birth control pills and postmenopausal status. - Discontinue birth control pills to assess hormone-related migraines. - Prescribe new migraine medication for onset, repeatable in 24 hours if needed.Nurtec ODT samples x 4 given.  - Monitor migraine changes post-discontinuation of birth control. - Consider neurologist referral if migraines persist or worsen.  Postmenopausal status Likely postmenopausal due to age and absence of menstrual cycles, birth control pills may be unnecessary. - Discontinue birth control pills to confirm postmenopausal status. - Check hormone levels 2-4 weeks post-discontinuation if she'd like or monitor menstrual cycles and menopausal symptoms - Discuss management of menopausal symptoms if she arises.  Weight gain Weight gain possibly linked to menopause and sertraline  use. - Monitor weight, continue diet and exercise. - Discuss sertraline  adjustments if weight gain persists.    Follow up: aug for cpe and f/u No orders of the defined types were placed in this encounter.  Meds ordered this encounter  Medications   Rimegepant Sulfate (NURTEC) 75 MG TBDP    Sig: Take 1 tablet (75 mg total) by mouth daily as needed.     I reviewed the patients updated PMH, FH, and SocHx.  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   budesonide  (ENTOCORT EC ) 3 MG 24 hr capsule Take 3 capsules (9 mg total) by mouth daily. (Patient taking differently: Take 6 mg by mouth daily.)   Multiple Vitamin (MULTIVITAMIN) tablet Take 1 tablet by mouth daily.   Rimegepant Sulfate (NURTEC) 75 MG TBDP Take 1 tablet (75 mg total) by mouth daily as needed.   sertraline  (ZOLOFT ) 50 MG tablet TAKE 1 TABLET DAILY   SUMAtriptan  (IMITREX ) 50 MG tablet Take 1 tablet (50 mg total) by mouth every 2 (two) hours as needed for migraine. May repeat in 2 hours if headache persists or recurs.   Allergies: Patient is allergic to sulfa antibiotics. Family History: Patient family history includes Alcoholism in her maternal grandfather; Asthma in her daughter; Diabetes in her father and paternal grandfather; Healthy in her daughter, daughter, and son; Heart disease in her maternal grandfather; Hypertension in her father. Social History:  Patient  reports that she has never smoked. She has never used smokeless tobacco. She reports that she does not drink alcohol and does not use drugs.  Review of Systems: Constitutional: Negative for fever malaise or anorexia Cardiovascular: negative for chest pain Respiratory: negative for SOB or persistent cough Gastrointestinal: negative for abdominal pain Neuro: no severe headaches, diplopia, neurologic sxs  Objective  Vitals: BP (!) 149/83   Pulse 70   Temp 97.7 F (36.5 C)   Ht 5\' 2"  (1.575 m)   Wt 136 lb 3.2 oz (61.8 kg)   SpO2 97%   BMI 24.91 kg/m  General: no acute distress , A&Ox3  Commons side effects, risks, benefits, and alternatives for medications and treatment plan prescribed today were discussed, and the patient expressed understanding of the given instructions. Patient is instructed to call or message via MyChart if he/she has any questions or concerns regarding our treatment plan. No barriers to understanding  were identified. We discussed Red Flag symptoms and signs in detail. Patient expressed understanding regarding what to do in case of urgent or emergency type symptoms.  Medication list was reconciled, printed and provided to the patient in AVS. Patient instructions and summary information was reviewed with the patient as documented in the AVS. This note was prepared with assistance of Dragon voice recognition software. Occasional wrong-word or sound-a-like substitutions may have occurred due to the inherent limitations of voice recognition software

## 2024-03-22 ENCOUNTER — Encounter: Payer: Self-pay | Admitting: Family Medicine

## 2024-03-23 MED ORDER — NURTEC 75 MG PO TBDP: 75.0000 mg | ORAL_TABLET | Freq: Every day | ORAL | 2 refills | Status: AC | PRN

## 2024-03-26 ENCOUNTER — Other Ambulatory Visit (HOSPITAL_COMMUNITY): Payer: Self-pay

## 2024-03-26 ENCOUNTER — Telehealth: Payer: Self-pay

## 2024-03-26 NOTE — Telephone Encounter (Signed)
 Pharmacy Patient Advocate Encounter   Received notification from Onbase that prior authorization for NURTEC 74MG  ODT is required/requested.   Insurance verification completed.   The patient is insured through [facility] .   Per test claim: PA required; PA submitted to above mentioned insurance via CoverMyMeds Key/confirmation #/EOC [id number] Status is pending

## 2024-03-26 NOTE — Telephone Encounter (Signed)
 Pharmacy Patient Advocate Encounter  Received notification from [employer] that Prior Authorization for NURTEC 75MG  ODT has been APPROVED from 03/26/24 to 06/26/24   PA #/Case ID/Reference #: [id number]  -Health plan's criteria covers 16 tablets per 30 days.

## 2024-04-09 ENCOUNTER — Encounter: Payer: Self-pay | Admitting: Gastroenterology

## 2024-05-23 ENCOUNTER — Encounter: Admitting: Family Medicine

## 2024-06-04 ENCOUNTER — Other Ambulatory Visit: Payer: Self-pay | Admitting: Medical Genetics

## 2024-06-06 ENCOUNTER — Ambulatory Visit (INDEPENDENT_AMBULATORY_CARE_PROVIDER_SITE_OTHER): Admitting: Family Medicine

## 2024-06-06 ENCOUNTER — Encounter: Payer: Self-pay | Admitting: Family Medicine

## 2024-06-06 VITALS — BP 130/82 | HR 78 | Temp 97.9°F | Ht 63.0 in | Wt 135.8 lb

## 2024-06-06 DIAGNOSIS — I471 Supraventricular tachycardia, unspecified: Secondary | ICD-10-CM | POA: Diagnosis not present

## 2024-06-06 DIAGNOSIS — Z0001 Encounter for general adult medical examination with abnormal findings: Secondary | ICD-10-CM

## 2024-06-06 DIAGNOSIS — G43009 Migraine without aura, not intractable, without status migrainosus: Secondary | ICD-10-CM | POA: Diagnosis not present

## 2024-06-06 DIAGNOSIS — Z0000 Encounter for general adult medical examination without abnormal findings: Secondary | ICD-10-CM | POA: Diagnosis not present

## 2024-06-06 DIAGNOSIS — K52832 Lymphocytic colitis: Secondary | ICD-10-CM

## 2024-06-06 DIAGNOSIS — M1990 Unspecified osteoarthritis, unspecified site: Secondary | ICD-10-CM | POA: Diagnosis not present

## 2024-06-06 DIAGNOSIS — K580 Irritable bowel syndrome with diarrhea: Secondary | ICD-10-CM

## 2024-06-06 DIAGNOSIS — Z780 Asymptomatic menopausal state: Secondary | ICD-10-CM

## 2024-06-06 DIAGNOSIS — R109 Unspecified abdominal pain: Secondary | ICD-10-CM

## 2024-06-06 MED ORDER — SERTRALINE HCL 50 MG PO TABS: 50.0000 mg | ORAL_TABLET | Freq: Every day | ORAL | 3 refills | Status: DC

## 2024-06-06 MED ORDER — BUDESONIDE 3 MG PO CPEP: 6.0000 mg | ORAL_CAPSULE | Freq: Every day | ORAL | Status: AC

## 2024-06-06 NOTE — Patient Instructions (Signed)
Please return in 12 months for your annual complete physical; please come fasting.   I will release your lab results to you on your MyChart account with further instructions. You may see the results before I do, but when I review them I will send you a message with my report or have my assistant call you if things need to be discussed. Please reply to my message with any questions. Thank you!   If you have any questions or concerns, please don't hesitate to send me a message via MyChart or call the office at [phone]. Thank you for visiting with [place] today! It's our pleasure caring for you.   Calcium Intake Recommendations You can take Caltrate Plus twice a day or get it through your diet or other OTC supplements (Viactiv, OsCal etc)  Calcium is a mineral that affects many functions in the body, including: Blood clotting. Blood vessel function. Nerve impulse conduction. Hormone secretion. Muscle contraction. Bone and teeth functions.  Most of your body's calcium supply is stored in your bones and teeth. When your calcium stores are low, you may be at risk for low bone mass, bone loss, and bone fractures. Consuming enough calcium helps to grow healthy bones and teeth and to prevent breakdown over time. It is very important that you get enough calcium if you are: A child undergoing rapid growth. An adolescent girl. A pre- or post-menopausal woman. A woman whose menstrual cycle has stopped due to anorexia nervosa or regular intense exercise. An individual with lactose intolerance or a milk allergy. A vegetarian.  What is my plan? Try to consume the recommended amount of calcium daily based on your age. Depending on your overall health, your health care provider may recommend increased calcium intake. General daily calcium intake recommendations by age are: Birth to 6 months: 200 mg. Infants 7 to 12 months: 260 mg. Children 1 to 3 years: 700 mg. Children 4 to 8 years: 1,000 mg. Children  9 to 13 years: 1,300 mg. Teens 14 to 18 years: 1,300 mg. Adults 19 to 50 years: 1,000 mg. Adult women 51 to 70 years: 1,200 mg. Adult men 51 to 70 years: 1,000 mg. Adults 71 years and older: 1,200 mg. Pregnant and breastfeeding teens: 1,300 mg. Pregnant and breastfeeding adults: 1,000 mg.  What do I need to know about calcium intake? In order for the body to absorb calcium, it needs vitamin D. You can get vitamin D through (we recommend getting [phone] units of Vitamin D daily) Direct exposure of the skin to sunlight. Foods, such as egg yolks, liver, saltwater fish, and fortified milk. Supplements. Consuming too much calcium may cause: Constipation. Decreased absorption of iron and zinc. Kidney stones. Calcium supplements may interact with certain medicines. Check with your health care provider before starting any calcium supplements. Try to get most of your calcium from food. What foods can I eat? Grains  Fortified oatmeal. Fortified ready-to-eat cereals. Fortified frozen waffles. Vegetables Turnip greens. Broccoli. Fruits Fortified orange juice. Meats and Other Protein Sources Canned sardines with bones. Canned salmon with bones. Soy beans. Tofu. Baked beans. Almonds. [place] nuts. Sunflower seeds. Dairy Milk. Yogurt. Cheese. Cottage cheese. Beverages Fortified soy milk. Fortified rice milk. Sweets/Desserts Pudding. Ice Cream. Milkshakes. Blackstrap molasses. The items listed above may not be a complete list of recommended foods or beverages. Contact your dietitian for more options. What foods can affect my calcium intake? It may be more difficult for your body to use calcium or calcium may leave your  body more quickly if you consume large amounts of: Sodium. Protein. Caffeine. Alcohol.  This information is not intended to replace advice given to you by your health care provider. Make sure you discuss any questions you have with your health care provider. Document  Released: 05/04/2004 Document Revised: 04/09/2016 Document Reviewed: 02/26/2014 Elsevier Interactive Patient Education  2018 [employer].

## 2024-06-06 NOTE — Progress Notes (Unsigned)
 Subjective  Chief Complaint  Patient presents with   Annual Exam    Pt here for Annual Exam and is not currently fasting. Mammo was done at [facility]    HPI: Kelsey Johnson is a 53 y.o. female who presents to [facility] [place] today for a Female Wellness Visit. She also has the concerns and/or needs as listed above in the chief complaint. These will be addressed in addition to the Health Maintenance Visit.   Wellness Visit: annual visit with health maintenance review and exam  HM: Sees GYN, Kelsey Johnson, however she is going to retire in January.  Pap smear current.  Mammogram reportedly current.  Will request records.  Eligible for flu shot and Prevnar.  She does report that she got pretty sick after her flu shot last year.  Lifestyle is healthy for the most part.  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  No new concerns. Chronic disease f/u and/or acute problem visit: (deemed necessary to be done in addition to the wellness visit): Lymphocytic colitis and history of IBS: Followed by GI.  Fortunately this has improved.  She has been able to lower her dose of budesonide .  Mostly normal bowel movements although still with some cramping loose stools intermittently.  No blood in the stool.  No fevers or chills.  No significant chronic abdominal symptoms.  However she does report that she has right lower quadrant or middle quadrant abdominal pain for 3 days.  Described as contractions.  Moderate in intensity at first.  Unrelated to meals.  No associated nausea, fever, chills, diarrhea, constipation.  At first it did interfere with her sleep.  However over the last 24 hours intensity has lessened.  She has had no abdominal surgeries. History of SVT: Currently eval palpitations or chest pain. Migraine with aura likely hormonal.  Doing much better.  Having rare migraines at this time.  Migraines are treated quickly with Nurtec without side effects.  In retrospect, since  stopping birth control pills, her migraines have lessened in frequency significantly. Remains on sertraline  daily.  Helps with postmenopausal mood changes and IBS symptoms. Postmenopausal now off birth control.  Does report feels more aches and pains all over.  Having some difficulty losing weight.  Assessment  1. Encounter for well adult exam with abnormal findings   2. Lymphocytic colitis   3. Inflammatory arthritis   4. Paroxysmal SVT (supraventricular tachycardia) (HCC)   5. Irritable bowel syndrome with diarrhea   6. Migraine without aura and without status migrainosus, not intractable   7. Right lateral abdominal pain   8. Postmenopausal      Plan  Female Wellness Visit: Age appropriate Health Maintenance and Prevention measures were discussed with patient. Included topics are cancer screening recommendations, ways to keep healthy (see AVS) including dietary and exercise recommendations, regular eye and dental care, use of seat belts, and avoidance of moderate alcohol use and tobacco use.  Will request mammogram report.  Screens are up-to-date. BMI: discussed patient's BMI and encouraged positive lifestyle modifications to help get to or maintain a target BMI. HM needs and immunizations were addressed and ordered. See below for orders. See HM and immunization section for updates.  Education and counseling given on vaccination recommendations.  Patient defers Prevnar and flu vaccine for now.  She will consider.  Can return for nurse visit if she would like. Routine labs and screening tests ordered including cmp, cbc and lipids where appropriate. Discussed recommendations regarding Vit D  and calcium supplementation (see AVS)  Chronic disease management visit and/or acute problem visit: Lymphocytic colitis and IBS: This could be the cause of her right abdominal pain.  Improving.  No red flag symptoms.  Benign abdomen.  Will follow.  Monitor bowel habits.  Gas-X if needed. PSVT is stable.   Rare symptoms now.   Osteoarthritis and inflammatory arthritis: Topical Voltaren as needed.  Tylenol .  Mild symptoms by report Postmenopausal symptoms: Discussed possibilities of postmenopausal symptoms and could start HRT if she would like.  She will consider Continue Zoloft  Monitor blood pressure  Follow up: 1 year for complete physical Orders Placed This Encounter  Procedures   CBC with Differential/Platelet   Comprehensive metabolic panel with GFR   Lipid panel   TSH   Meds ordered this encounter  Medications   budesonide  (ENTOCORT EC ) 3 MG 24 hr capsule    Sig: Take 2 capsules (6 mg total) by mouth daily.   sertraline  (ZOLOFT ) 50 MG tablet    Sig: Take 1 tablet (50 mg total) by mouth daily.    Dispense:  90 tablet    Refill:  3      Body mass index is 24.06 kg/m. Wt Readings from Last 3 Encounters:  06/06/24 135 lb 12.8 oz (61.6 kg)  03/12/24 136 lb 3.2 oz (61.8 kg)  12/20/23 133 lb (60.3 kg)     Patient Active Problem List   Diagnosis Date Noted   Lymphocytic colitis 08/27/2020    Priority: High    Entocort, Dr. Legrand, GI    Inflammatory arthritis 05/19/2017    Priority: High    Followed at [facility]. 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 Controlled on Celebrex. 05/19/2017     Paroxysmal SVT (supraventricular tachycardia) (HCC) 07/27/2011    Priority: High   Postmenopausal 06/06/2024    Priority: Medium    Migraine without aura and without status migrainosus, not intractable 03/12/2024    Priority: Medium     Diagnosed 2025, pcp: no imaging. No red flags. Trial of triptans initiated.  Failed maxalt  Responsive to nurtec, likely hormonally related. 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    Priority: Medium    Irritable bowel syndrome 07/27/2011    Priority: Medium    Health Maintenance  Topic Date Due   Hepatitis B Vaccines 19-59 Average Risk (1 of 3 - 19+ 3-dose series) Never done   MAMMOGRAM  05/10/2024   INFLUENZA  VACCINE  01/01/2025 (Originally 05/04/2024)   Pneumococcal Vaccine: 50+ Years (1 of 2 - PCV) 06/06/2025 (Originally 03/09/1990)   Cervical Cancer Screening (HPV/Pap Cotest)  05/05/2027   Colonoscopy  04/28/2030   DTaP/Tdap/Td (3 - Td or Tdap) 04/19/2032   Hepatitis C Screening  Completed   HIV Screening  Completed   Zoster Vaccines- Shingrix   Completed   HPV VACCINES  Aged Out   Meningococcal B Vaccine  Aged Out   COVID-19 Vaccine  Discontinued   Immunization History  Administered Date(s) Administered   Influenza Inj Mdck Quad Pf 07/05/2023   Influenza,inj,Quad PF,6+ Mos 07/13/2018, 07/24/2019, 08/27/2020   PPD Test 04/26/2017   Tdap 07/28/2011, 04/19/2022   Zoster Recombinant(Shingrix ) 04/19/2022, 09/29/2022   We updated and reviewed the patient's past history in detail and it is documented below. Allergies: Patient is allergic to sulfa antibiotics. Past Medical History Patient  has a past medical history of Anxiety, Arthritis, CTIN (chronic tubulo-interstitial nephritis), IBS (irritable bowel syndrome), Intractable migraine without aura and without status migrainosus (12/05/2023), Kleine-Levin syndrome (2002),  Microscopic colitis, and Supraventricular tachycardia (HCC) (2011). Past Surgical History Patient  has a past surgical history that includes Tympanostomy tube placement and Adenoidectomy. Family History: Patient family history includes Alcoholism in her maternal grandfather; Asthma in her daughter; Diabetes in her father and paternal grandfather; Healthy in her daughter, daughter, and son; Heart disease in her maternal grandfather; Hypertension in her father. Social History:  Patient  reports that she has never smoked. She has never used smokeless tobacco. She reports that she does not drink alcohol and does not use drugs.  Review of Systems: Constitutional: negative for fever or malaise Ophthalmic: negative for photophobia, double vision or loss of vision Cardiovascular:  negative for chest pain, dyspnea on exertion, or new LE swelling Respiratory: negative for SOB or persistent cough Gastrointestinal: negative for abdominal pain, change in bowel habits or melena Genitourinary: negative for dysuria or gross hematuria, no abnormal uterine bleeding or disharge Musculoskeletal: negative for new gait disturbance or muscular weakness Integumentary: negative for new or persistent rashes, no breast lumps Neurological: negative for TIA or stroke symptoms Psychiatric: negative for SI or delusions Allergic/Immunologic: negative for hives  Patient Care Team    Relationship Specialty Notifications Start End  Jodie Lavern CROME, MD PCP - General Family Medicine  12/12/19   Verta Blossom, CNM Midwife Obstetrics and Gynecology  05/14/19   Legrand Victory CROME DOUGLAS, MD Consulting Physician Gastroenterology  12/12/19   Nahser, Aleene PARAS, MD (Inactive) Consulting Physician Cardiology  12/12/19     Objective  Vitals: BP 130/82   Pulse 78   Temp 97.9 F (36.6 C)   Ht 5' 3 (1.6 m)   Wt 135 lb 12.8 oz (61.6 kg)   SpO2 98%   BMI 24.06 kg/m  General:  Well developed, well nourished, no acute distress  Psych:  Alert and orientedx3,normal mood and affect HEENT:  Normocephalic, atraumatic, non-icteric sclera,  supple neck without adenopathy, mass or thyromegaly Cardiovascular:  Normal S1, S2, RRR without gallop, rub or murmur Respiratory:  Good breath sounds bilaterally, CTAB with normal respiratory effort Gastrointestinal: normal bowel sounds, soft, non-tender, no noted masses. No HSM MSK: extremities without edema, joints without erythema or swelling, few osteoarthritic changes in distal joints of hands.  No erythema or warmth. Neurologic:    Mental status is normal.  Gross motor and sensory exams are normal.  No tremor  Commons side effects, risks, benefits, and alternatives for medications and treatment plan prescribed today were discussed, and the patient expressed understanding of  the given instructions. Patient is instructed to call or message via MyChart if he/she has any questions or concerns regarding our treatment plan. No barriers to understanding were identified. We discussed Red Flag symptoms and signs in detail. Patient expressed understanding regarding what to do in case of urgent or emergency type symptoms.  Medication list was reconciled, printed and provided to the patient in AVS. Patient instructions and summary information was reviewed with the patient as documented in the AVS. This note was prepared with assistance of Dragon voice recognition software. Occasional wrong-word or sound-a-like substitutions may have occurred due to the inherent limitations of voice recognition software

## 2024-06-07 LAB — CBC WITH DIFFERENTIAL/PLATELET
Basophils Absolute: 0 K/uL (ref 0.0–0.1)
Basophils Relative: 0.4 % (ref 0.0–3.0)
Eosinophils Absolute: 0.5 K/uL (ref 0.0–0.7)
Eosinophils Relative: 6.7 % — ABNORMAL HIGH (ref 0.0–5.0)
HCT: 40.1 % (ref 36.0–46.0)
Hemoglobin: 13.2 g/dL (ref 12.0–15.0)
Lymphocytes Relative: 24.4 % (ref 12.0–46.0)
Lymphs Abs: 1.7 K/uL (ref 0.7–4.0)
MCHC: 33 g/dL (ref 30.0–36.0)
MCV: 91.2 fl (ref 78.0–100.0)
Monocytes Absolute: 0.7 K/uL (ref 0.1–1.0)
Monocytes Relative: 9.6 % (ref 3.0–12.0)
Neutro Abs: 4.1 K/uL (ref 1.4–7.7)
Neutrophils Relative %: 58.9 % (ref 43.0–77.0)
Platelets: 333 K/uL (ref 150.0–400.0)
RBC: 4.39 Mil/uL (ref 3.87–5.11)
RDW: 12.6 % (ref 11.5–15.5)
WBC: 6.9 K/uL (ref 4.0–10.5)

## 2024-06-07 LAB — COMPREHENSIVE METABOLIC PANEL WITH GFR
ALT: 37 U/L — ABNORMAL HIGH (ref 0–35)
AST: 26 U/L (ref 0–37)
Albumin: 4.3 g/dL (ref 3.5–5.2)
Alkaline Phosphatase: 88 U/L (ref 39–117)
BUN: 19 mg/dL (ref 6–23)
CO2: 27 meq/L (ref 19–32)
Calcium: 9.5 mg/dL (ref 8.4–10.5)
Chloride: 99 meq/L (ref 96–112)
Creatinine, Ser: 0.89 mg/dL (ref 0.40–1.20)
GFR: 74.11 mL/min (ref >60.00)
Glucose, Bld: 76 mg/dL (ref 70–99)
Potassium: 4.2 meq/L (ref 3.5–5.1)
Sodium: 138 meq/L (ref 135–145)
Total Bilirubin: 0.3 mg/dL (ref 0.2–1.2)
Total Protein: 7.4 g/dL (ref 6.0–8.3)

## 2024-06-07 LAB — LIPID PANEL
Cholesterol: 190 mg/dL (ref 0–200)
HDL: 45 mg/dL (ref >39.00)
LDL Cholesterol: 103 mg/dL — ABNORMAL HIGH (ref 0–99)
NonHDL: 144.94
Total CHOL/HDL Ratio: 4
Triglycerides: 212 mg/dL — ABNORMAL HIGH (ref 0.0–149.0)
VLDL: 42.4 mg/dL — ABNORMAL HIGH (ref 0.0–40.0)

## 2024-06-07 LAB — TSH: TSH: 0.59 u[IU]/mL (ref 0.35–5.50)

## 2024-06-12 ENCOUNTER — Encounter: Payer: Self-pay | Admitting: Family Medicine

## 2024-06-17 ENCOUNTER — Ambulatory Visit: Payer: Self-pay | Admitting: Family Medicine

## 2024-06-17 NOTE — Progress Notes (Signed)
 See mychart note The 10-year ASCVD risk score (Arnett DK, et al., 2019) is: 2%   Values used to calculate the score:     Age: 53 years     Clincally relevant sex: Female     Is Non-Hispanic African American: No     Diabetic: No     Tobacco smoker: No     Systolic Blood Pressure: 130 mmHg     Is BP treated: No     HDL Cholesterol: 45 mg/dL     Total Cholesterol: 190 mg/dL See mychart message; stable labs explained to pt.

## 2024-07-02 ENCOUNTER — Encounter: Payer: Self-pay | Admitting: Gastroenterology

## 2024-07-20 ENCOUNTER — Other Ambulatory Visit: Payer: Self-pay | Admitting: Family Medicine

## 2024-08-14 ENCOUNTER — Encounter: Payer: Self-pay | Admitting: Gastroenterology

## 2024-08-21 ENCOUNTER — Encounter: Payer: Self-pay | Admitting: Gastroenterology

## 2024-08-21 ENCOUNTER — Ambulatory Visit: Admitting: Gastroenterology

## 2024-08-21 VITALS — BP 124/78 | HR 69 | Ht 62.0 in | Wt 133.0 lb

## 2024-08-21 DIAGNOSIS — K52832 Lymphocytic colitis: Secondary | ICD-10-CM | POA: Diagnosis not present

## 2024-08-21 DIAGNOSIS — R140 Abdominal distension (gaseous): Secondary | ICD-10-CM

## 2024-08-21 DIAGNOSIS — K580 Irritable bowel syndrome with diarrhea: Secondary | ICD-10-CM

## 2024-08-21 MED ORDER — HYOSCYAMINE SULFATE ER 0.375 MG PO TB12: 0.3750 mg | ORAL_TABLET | ORAL | 1 refills | Status: AC | PRN

## 2024-08-21 MED ORDER — RIFAXIMIN 550 MG PO TABS: 550.0000 mg | ORAL_TABLET | Freq: Three times a day (TID) | ORAL | 0 refills | Status: AC

## 2024-08-21 NOTE — Patient Instructions (Addendum)
 _______________________________________________________  If your blood pressure at your visit was 140/90 or greater, please contact your primary care physician to follow up on this.  _______________________________________________________  If you are age 53 or older, your body mass index should be between 23-30. Your Body mass index is 24.33 kg/m. If this is out of the aforementioned range listed, please consider follow up with your Primary Care Provider.  If you are age 61 or younger, your body mass index should be between 19-25. Your Body mass index is 24.33 kg/m. If this is out of the aformentioned range listed, please consider follow up with your Primary Care Provider.   ________________________________________________________  The [facility] providers would like to encourage you to use MYCHART to communicate with providers for non-urgent requests or questions.  Due to long hold times on the telephone, sending your provider a message by [facility] may be a faster and more efficient way to get a response.  Please allow 48 business hours for a response.  Please remember that this is for non-urgent requests.  _______________________________________________________  [facility] is using a team-based approach to care.  Your team is made up of your doctor and two to three APPS. Our APPS (Nurse Practitioners and Physician Assistants) work with your physician to ensure care continuity for you. They are fully qualified to address your health concerns and develop a treatment plan. They communicate directly with your gastroenterologist to care for you. Seeing the Advanced Practice Practitioners on your physician's team can help you by facilitating care more promptly, often allowing for earlier appointments, access to diagnostic testing, procedures, and other specialty referrals.    Thank you for trusting me with your gastrointestinal care!    Dr. Victory Legrand DOUGLAS [facility]

## 2024-08-21 NOTE — Progress Notes (Signed)
 [name] Progress Note  Chief Complaint: Bloating and diarrhea  Subjective  Prior history  Lymphocytic colitis diagnosed May 2021, GERD, and IBS. Takes budesonide  episodically and as needed Levsin . Tried to taper down sertraline  at 1 point thinking this might be contributing to the colitis, but flared up anxiety and had to resume. Contacted us  November 2024 having a flareup of symptoms with concomitant stress of her son's wedding.  She decided to go back to budesonide  few weeks later for persistent symptoms. Was doing well on budesonide  6 mg daily at March 2025 office visit.  Recommended weaning off it again  Was feeling well on an update by portal message July 2025 Diarrhea still under control portal message September 2025, but complaining of gas Contacted office 08/14/2024 back on budesonide  and having diarrhea and urgency.   Discussed the use of AI scribe software for clinical note transcription with the patient, who gave verbal consent to proceed.  History of Present Illness Kelsey Johnson is a 53 year old female with lymphocytic colitis who presents with increased gastrointestinal symptoms including gas, urgency, and loose stools.  Altered bowel habits - Increased gastrointestinal symptoms over the past few months, including gas, urgency, and loose stools - Symptoms are unpredictable and have significantly impacted daily activities, including an episode of incontinence at a recreation center two weeks ago - Bowel movement frequency varies: approximately three per day on good days and up to six per day on bad days - No blood in stool - Recent increase in budesonide  dosage to 9 mg daily (3 mg three times a day) about one week ago - Since increasing budesonide , no diarrhea but now experiencing constipation and persistent urgency without stool passage.  The constipation is described as intermittently feeling the need for a BM but does not occur.  Stools have become formed  after the increase budesonide  dose.  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 Past Medical History:  Diagnosis Date   Anxiety    from IBS   Arthritis    CTIN (chronic tubulo-interstitial nephritis)    IBS (irritable bowel syndrome)    Intractable migraine without aura and without status migrainosus 12/05/2023   Diagnosed 2025: no imaging. No red flags. Trial of triptans initiated.  Failed maxalt      Kleine-Levin syndrome 2002   Microscopic colitis    Supraventricular tachycardia 2011   Dr. Alveta    Past Surgical History:  Procedure Laterality Date   ADENOIDECTOMY     As a child   TYMPANOSTOMY TUBE PLACEMENT     As a child     Objective:  Med list reviewed  Current Outpatient Medications:    budesonide  (ENTOCORT EC ) 3 MG 24 hr capsule, Take 2 capsules (6 mg total) by mouth daily., Disp: ,  Rfl:    hyoscyamine  (LEVBID ) 0.375 MG 12 hr tablet, Take 1 tablet (0.375 mg total) by mouth as needed., Disp: 30 tablet, Rfl: 1   Hyoscyamine  Sulfate SL 0.125 MG SUBL, , Disp: , Rfl:    Multiple Vitamin (MULTIVITAMIN) tablet, Take 1 tablet by mouth daily., Disp: , Rfl:    rifaximin (XIFAXAN) 550 MG TABS tablet, Take 1 tablet (550 mg total) by mouth 3 (three) times  daily for 10 days., Disp: 30 tablet, Rfl: 0   Rimegepant Sulfate (NURTEC) 75 MG TBDP, Take 1 tablet (75 mg total) by mouth daily as needed., Disp: 15 tablet, Rfl: 2   sertraline  (ZOLOFT ) 50 MG tablet, TAKE 1 TABLET DAILY, Disp: 90 tablet, Rfl: 3   Vital signs in last 24 hrs: Vitals:   08/21/24 1423  BP: 124/78  Pulse: 69   Wt Readings from Last 3 Encounters:  08/21/24 133 lb (60.3 kg)  06/06/24 135 lb 12.8 oz (61.6 kg)  03/12/24 136 lb 3.2 oz (61.8 kg)    Physical Exam  Well-appearing HEENT: sclera anicteric, oral mucosa moist without lesions Neck: supple, no thyromegaly, JVD or lymphadenopathy Cardiac: Regular without appreciable murmur,  no peripheral edema Pulm: clear to auscultation bilaterally, normal RR and effort noted Abdomen: soft, no tenderness, with active bowel sounds. No guarding or palpable hepatosplenomegaly. Skin; warm and dry, no jaundice or rash   Labs:   ___________________________________________ Radiologic studies:   ____________________________________________ Other:   _____________________________________________   Encounter Diagnoses  Name Primary?   Lymphocytic colitis Yes   Irritable bowel syndrome with diarrhea    Abdominal bloating     Assessment and Plan Assessment & Plan Lymphocytic colitis with irritable bowel syndrome overlay Chronic lymphocytic colitis with IBS overlay, presenting with increased gas, urgency, and loose stools. Symptoms worsened since late summer. Budesonide  9 mg/day improved stool consistency. Differential includes bile acid diarrhea and SIBO. Bile acid diarrhea unlikely due to gallbladder presence. SIBO considered due to symptom overlap. - Continue budesonide  9 mg/day.  Expect this will take a few weeks to get things settle down.  She will portal message me around that time with an update. - Prescribed this higher dose Levsin  0.375 mg as needed. - Prescribed rifaximin 550 mg 3 times daily for 10 days as empiric  therapy for possible SIBO    Suspected small intestine bacterial overgrowth SIBO suspected due to symptom overlap with lymphocytic colitis and IBS. Empirical rifaximin treatment proposed due to low risk and potential benefit. - Ordered breath testing for SIBO. - Prescribed rifaximin 550 mg for 10 days.    30 minutes were spent on this encounter (including chart review, history/exam, counseling/coordination of care, and documentation) > 50% of that time was spent on counseling and coordination of care.   Victory LITTIE Brand III

## 2024-08-24 ENCOUNTER — Telehealth: Payer: Self-pay

## 2024-08-24 ENCOUNTER — Other Ambulatory Visit (HOSPITAL_COMMUNITY): Payer: Self-pay

## 2024-08-24 NOTE — Telephone Encounter (Signed)
 Pharmacy Patient Advocate Encounter   Received notification from Patient Pharmacy that prior authorization for Xifaxan  550MG  Tablets is required/requested.   Insurance verification completed.   The patient is insured through [facility].   Prior Authorization form/request asks a question that requires your assistance. Please see the question below and advise accordingly. The PA will not be submitted until the necessary information is received.

## 2024-08-24 NOTE — Telephone Encounter (Signed)
 Pharmacy Patient Advocate Encounter    Per test claim: PA required; PA submitted to above mentioned insurance via Latent Key/confirmation #/EOC [id number] Status is pending

## 2024-08-27 ENCOUNTER — Encounter: Payer: Self-pay | Admitting: Gastroenterology

## 2024-08-27 NOTE — Telephone Encounter (Signed)
 Pharmacy Patient Advocate Encounter  Received notification from [employer] that Prior Authorization for Xifaxan  550MG  tablets has been DENIED.  Full denial letter will be uploaded to the media tab. See denial reason below.  Per your health plan's criteria, this drug is covered if you meet the following:  You have tried or cannot use Viberzi.  The information provided does not show that you meet the criteria listed above.   **Please note: The drug listed above may require additional review.  PA #/Case ID/Reference #: [id number]

## 2024-08-27 NOTE — Telephone Encounter (Signed)
 Despite her insurance formulary preferences, neither a TCA or Viberzi would be appropriate for this patient.  We had hope to try this rifaximin  as empiric therapy for possible SIBO occurring with her lymphocytic colitis.  Please portal message patient about this this and see if she would like to do the SIBO breath test or give the higher dose of Budesonide  some more time.  - H. Danis

## 2024-08-28 NOTE — Telephone Encounter (Signed)
 Portal message sent to pt in separate encounter to determine which next steps she would like to take.

## 2024-08-29 ENCOUNTER — Encounter: Admitting: Family Medicine

## 2024-09-26 ENCOUNTER — Ambulatory Visit: Admitting: Physician Assistant

## 2024-10-26 ENCOUNTER — Other Ambulatory Visit: Payer: Self-pay | Admitting: Medical Genetics

## 2024-10-26 DIAGNOSIS — Z006 Encounter for examination for normal comparison and control in clinical research program: Secondary | ICD-10-CM

## 2025-06-10 ENCOUNTER — Encounter: Admitting: Family Medicine

## 2025-06-12 ENCOUNTER — Encounter: Admitting: Family Medicine
